# Patient Record
Sex: Female | Born: 1970
Health system: Southern US, Community
[De-identification: ages and names within clinical notes are randomized; demographics above are authoritative.]

## PROBLEM LIST (undated history)

## (undated) DIAGNOSIS — Z808 Family history of malignant neoplasm of other organs or systems: Secondary | ICD-10-CM

## (undated) DIAGNOSIS — N200 Calculus of kidney: Secondary | ICD-10-CM

## (undated) DIAGNOSIS — Z803 Family history of malignant neoplasm of breast: Secondary | ICD-10-CM

## (undated) DIAGNOSIS — I10 Essential (primary) hypertension: Secondary | ICD-10-CM

## (undated) DIAGNOSIS — K219 Gastro-esophageal reflux disease without esophagitis: Secondary | ICD-10-CM

## (undated) HISTORY — DX: Calculus of kidney: N20.0

## (undated) HISTORY — DX: Family history of malignant neoplasm of other organs or systems: Z80.8

## (undated) HISTORY — DX: Family history of malignant neoplasm of breast: Z80.3

## (undated) HISTORY — PX: BREAST SURGERY: SHX581

---

## 2010-11-11 ENCOUNTER — Emergency Department (HOSPITAL_BASED_OUTPATIENT_CLINIC_OR_DEPARTMENT_OTHER)
Admission: EM | Admit: 2010-11-11 | Discharge: 2010-11-11 | Payer: Self-pay | Source: Home / Self Care | Admitting: Emergency Medicine

## 2010-11-11 ENCOUNTER — Emergency Department (HOSPITAL_COMMUNITY)
Admission: EM | Admit: 2010-11-11 | Discharge: 2010-11-11 | Payer: Self-pay | Source: Home / Self Care | Admitting: Emergency Medicine

## 2011-02-18 LAB — URINALYSIS, ROUTINE W REFLEX MICROSCOPIC
Bilirubin Urine: NEGATIVE
Glucose, UA: NEGATIVE mg/dL
Ketones, ur: NEGATIVE mg/dL
Leukocytes, UA: NEGATIVE
Protein, ur: NEGATIVE mg/dL

## 2011-02-18 LAB — CBC
Hemoglobin: 12.3 g/dL (ref 12.0–15.0)
Platelets: 345 10*3/uL (ref 150–400)
RBC: 4.66 MIL/uL (ref 3.87–5.11)
WBC: 9.4 10*3/uL (ref 4.0–10.5)

## 2011-02-18 LAB — URINE MICROSCOPIC-ADD ON

## 2011-02-18 LAB — POCT I-STAT, CHEM 8
BUN: 8 mg/dL (ref 6–23)
Glucose, Bld: 159 mg/dL — ABNORMAL HIGH (ref 70–99)
Potassium: 3.7 mEq/L (ref 3.5–5.1)
Sodium: 143 mEq/L (ref 135–145)

## 2011-02-18 LAB — DIFFERENTIAL
Basophils Absolute: 0 10*3/uL (ref 0.0–0.1)
Basophils Relative: 0 % (ref 0–1)
Eosinophils Absolute: 0 10*3/uL (ref 0.0–0.7)
Eosinophils Relative: 0 % (ref 0–5)
Lymphocytes Relative: 5 % — ABNORMAL LOW (ref 12–46)
Monocytes Absolute: 0.1 10*3/uL (ref 0.1–1.0)

## 2011-12-09 HISTORY — PX: NASAL SEPTUM SURGERY: SHX37

## 2013-06-29 ENCOUNTER — Encounter (HOSPITAL_BASED_OUTPATIENT_CLINIC_OR_DEPARTMENT_OTHER): Payer: Self-pay | Admitting: Emergency Medicine

## 2013-06-29 ENCOUNTER — Emergency Department (HOSPITAL_BASED_OUTPATIENT_CLINIC_OR_DEPARTMENT_OTHER): Payer: Commercial Managed Care - PPO

## 2013-06-29 ENCOUNTER — Emergency Department (HOSPITAL_BASED_OUTPATIENT_CLINIC_OR_DEPARTMENT_OTHER)
Admission: EM | Admit: 2013-06-29 | Discharge: 2013-06-29 | Disposition: A | Discharge status: Home / Self Care | Payer: Commercial Managed Care - PPO | Attending: Emergency Medicine | Admitting: Emergency Medicine

## 2013-06-29 DIAGNOSIS — H538 Other visual disturbances: Secondary | ICD-10-CM | POA: Insufficient documentation

## 2013-06-29 DIAGNOSIS — G43909 Migraine, unspecified, not intractable, without status migrainosus: Secondary | ICD-10-CM | POA: Insufficient documentation

## 2013-06-29 DIAGNOSIS — R110 Nausea: Secondary | ICD-10-CM | POA: Insufficient documentation

## 2013-06-29 DIAGNOSIS — R42 Dizziness and giddiness: Secondary | ICD-10-CM | POA: Insufficient documentation

## 2013-06-29 MED ORDER — METOCLOPRAMIDE HCL 5 MG/ML IJ SOLN
10.0000 mg | Freq: Once | INTRAMUSCULAR | Status: AC
  Administered 2013-06-29: 10 mg via INTRAVENOUS
  Filled 2013-06-29: qty 2

## 2013-06-29 MED ORDER — SODIUM CHLORIDE 0.9 % IV BOLUS (SEPSIS)
1000.0000 mL | Freq: Once | INTRAVENOUS | Status: AC
  Administered 2013-06-29: 1000 mL via INTRAVENOUS

## 2013-06-29 MED ORDER — DIPHENHYDRAMINE HCL 50 MG/ML IJ SOLN
25.0000 mg | Freq: Once | INTRAMUSCULAR | Status: AC
  Administered 2013-06-29: 25 mg via INTRAVENOUS
  Filled 2013-06-29: qty 1

## 2013-06-29 NOTE — ED Notes (Signed)
Pt has had headache for the last week, gotten progressively worse. States it's on the top of her head, feels crushing. Rates pain 5 out of 10. Currently feels nauseous and dizzy. Denies chest pain, denies vomiting. States she's had vision changes with her headache. Took benydrl yesterday morning, soma in the afternoon, and took a hydrocodone last night, these medications did not relieve symptoms. States previously she's had fluid in her ear that's caused headaches, usually gets an allergy shot which relieves symptoms. Yesterday morning received an allergy shot at around noon, did not help symptoms.

## 2013-06-29 NOTE — ED Notes (Signed)
Patient transported to CT 

## 2013-06-29 NOTE — ED Provider Notes (Signed)
History    CSN: [id number] Arrival date & time 06/29/13  1137  First MD Initiated Contact with Patient 06/29/13 1247     Chief Complaint  Patient presents with  . Headache  . Dizziness  . Nausea   (Consider location/radiation/quality/duration/timing/severity/associated sxs/prior Treatment) The history is provided by the patient. No language interpreter was used.  Ashley Nichols is a 42 y/o F with PMHx of tension headaches presenting to the ED, with husband, with headache that has been continuous for the past week. Patient reported that she has been having a squeezing sensation to the top of her head that is constant, described as a pressure sensation. Stated that she is constantly feeling dizzy - whether at rest or with motion - described as if she is spinning. Patient reported that she has been having bilateral blurred vision this past week as well. Stated that this is different from her previous headaches- normally her headaches are frontal and last just a day without blurred vision and dizziness. Patient reported that she has used Excedrin, Iburpofen, Aleve without relief. Reported nausea as well. Denied fever, chills, speech difficulty, vomiting, neck pain, back pain, numbness, facial asymmetry, chest pain, shortness of breath, difficulty breathing. Denied worst headache of her life, denied sudden onset.  PCP Dr. Darlina Sicilian Patient reported that she has an appointment with [facility] on Monday 07/04/2013.    History reviewed. No pertinent past medical history. Past Surgical History  Procedure Laterality Date  . Breast surgery    . Nasal septum surgery  2013   History reviewed. No pertinent family history. History  Substance Use Topics  . Smoking status: Not on file  . Smokeless tobacco: Not on file  . Alcohol Use: Not on file   OB History   Grav Para Term Preterm Abortions TAB SAB Ect Mult Living                 Review of Systems  Constitutional: Negative for fever and  chills.  HENT: Negative for neck pain.   Eyes: Positive for visual disturbance.  Respiratory: Negative for cough, chest tightness and shortness of breath.   Cardiovascular: Negative for chest pain.  Gastrointestinal: Positive for nausea. Negative for vomiting and abdominal pain.  Neurological: Positive for dizziness and headaches. Negative for speech difficulty, weakness and numbness.  All other systems reviewed and are negative.    Allergies  Ivp dye  Home Medications  No current outpatient prescriptions on file. BP 128/79  Pulse 64  Temp(Src) 98.6 F (37 C)  Resp 18  Ht 5\' 6"  (1.676 m)  Wt 191 lb (86.637 kg)  BMI 30.84 kg/m2  SpO2 100%  LMP 06/19/2013 Physical Exam  Nursing note and vitals reviewed. Constitutional: She is oriented to person, place, and time. She appears well-developed and well-nourished. No distress.  HENT:  Head: Normocephalic and atraumatic.  Negative pain upon palpation to the face Negative facial asymmetry  Eyes: Conjunctivae and EOM are normal. Pupils are equal, round, and reactive to light. Right eye exhibits no discharge. Left eye exhibits no discharge.  Neck: Normal range of motion. Neck supple.  Cardiovascular: Normal rate, regular rhythm and normal heart sounds.  Exam reveals no friction rub.   No murmur heard. Pulses:      Radial pulses are 2+ on the right side, and 2+ on the left side.       Dorsalis pedis pulses are 2+ on the right side, and 2+ on the left side.  Pulmonary/Chest: Effort normal  and breath sounds normal. No respiratory distress. She has no wheezes. She has no rales.  Musculoskeletal: Normal range of motion.  Neurological: She is alert and oriented to person, place, and time. No cranial nerve deficit. She exhibits normal muscle tone. Coordination normal.  Cranial nerves III-XII grossly intact Sensation intact Strength 5+/5+ with resistance Negative Romberg Gait balanced and steady - without sway Negative speech difficulty  noted  Skin: Skin is warm and dry. No rash noted. She is not diaphoretic. No erythema.  Psychiatric: She has a normal mood and affect. Her behavior is normal. Thought content normal.    ED Course  Procedures (including critical care time)  3:32PM Patient reported that headache has improved tremendously. Denied nausea. Denied blurred vision, dizziness. Patient reported that she is feeling much better and is ready to go home.   Labs Reviewed - No data to display Ct Head Wo Contrast  06/29/2013   *RADIOLOGY REPORT*  Clinical Data: Headache, blurred vision, dizziness  CT HEAD WITHOUT CONTRAST  Technique:  Contiguous axial images were obtained from the base of the skull through the vertex without contrast.  Comparison: Brain MRI 11/11/2010 and head CT 11/11/2010 at [facility]  Findings: No acute hemorrhage, acute infarction, or mass lesion is identified.  No midline shift.  No ventriculomegaly.  No skull fracture.  Orbits and paranasal sinuses are intact.  IMPRESSION: No acute intracranial finding.   Original Report Authenticated By: Christiana Pellant, M.D.   1. Migraine     MDM  Patient presenting to the ED with headache. Stated that this is not the worst headache of her life. Patient has history of headaches, reported that this is different with dizziness, blurred vision, and longer lasting. Negative neurological deficits noted. Negative facial drooping. Pulses palpable. sensation intact. Gait proper and steady. Alert and oriented. Negative signs of weakness. CT scan head negative intracranial abnormalities, negative acute findings.  Patient doing well and feels better after medication. Doubt SAH. Doubt stroke. Suspicion to be migraine. Discussed case with Dr. Ardeen Jourdain who agreed. Discharged patient. Discussed with patient to rest and stay hydrated. Discussed with patient to take Excedrin over the counter. Referred patient to PCP, neurology - recommended patient to keep appointment  with headache clinic on Monday 07/04/2013. Discussed with patient that she will most likely need to be placed on medication to aid in reducing reoccurence of headaches and for headache management. Discussed with patient to avoid strenuous activities. Discussed with patient to continue to monitor symptoms and if symptoms are to worsen or change to report back to the ED - strict return instructions given.  Patient agreed to plan of care, understood, all questions answered.   Raymon Mutton, PA-C 06/29/13 2348

## 2013-07-01 NOTE — ED Provider Notes (Signed)
Medical screening examination/treatment/procedure(s) were performed by non-physician practitioner and as supervising physician I was immediately available for consultation/collaboration.   Rolan Bucco, MD 07/01/13 [phone]

## 2015-12-03 ENCOUNTER — Emergency Department (HOSPITAL_BASED_OUTPATIENT_CLINIC_OR_DEPARTMENT_OTHER): Payer: Commercial Managed Care - PPO

## 2015-12-03 ENCOUNTER — Encounter (HOSPITAL_COMMUNITY)
Admission: EM | Disposition: A | Discharge status: Home / Self Care | Payer: Self-pay | Source: Home / Self Care | Attending: Emergency Medicine

## 2015-12-03 ENCOUNTER — Emergency Department (HOSPITAL_COMMUNITY): Payer: Commercial Managed Care - PPO

## 2015-12-03 ENCOUNTER — Encounter (HOSPITAL_BASED_OUTPATIENT_CLINIC_OR_DEPARTMENT_OTHER): Payer: Self-pay

## 2015-12-03 ENCOUNTER — Emergency Department (HOSPITAL_COMMUNITY): Payer: Commercial Managed Care - PPO | Admitting: Certified Registered Nurse Anesthetist

## 2015-12-03 ENCOUNTER — Ambulatory Visit (HOSPITAL_BASED_OUTPATIENT_CLINIC_OR_DEPARTMENT_OTHER)
Admission: EM | Admit: 2015-12-03 | Discharge: 2015-12-04 | Disposition: A | Discharge status: Home / Self Care | Payer: Commercial Managed Care - PPO | Attending: Surgery | Admitting: Surgery

## 2015-12-03 DIAGNOSIS — K8010 Calculus of gallbladder with chronic cholecystitis without obstruction: Secondary | ICD-10-CM | POA: Insufficient documentation

## 2015-12-03 DIAGNOSIS — I10 Essential (primary) hypertension: Secondary | ICD-10-CM | POA: Insufficient documentation

## 2015-12-03 DIAGNOSIS — E669 Obesity, unspecified: Secondary | ICD-10-CM | POA: Insufficient documentation

## 2015-12-03 DIAGNOSIS — R109 Unspecified abdominal pain: Secondary | ICD-10-CM | POA: Diagnosis present

## 2015-12-03 DIAGNOSIS — Z6834 Body mass index (BMI) 34.0-34.9, adult: Secondary | ICD-10-CM | POA: Diagnosis not present

## 2015-12-03 DIAGNOSIS — K8020 Calculus of gallbladder without cholecystitis without obstruction: Secondary | ICD-10-CM

## 2015-12-03 DIAGNOSIS — K810 Acute cholecystitis: Secondary | ICD-10-CM | POA: Diagnosis present

## 2015-12-03 DIAGNOSIS — K219 Gastro-esophageal reflux disease without esophagitis: Secondary | ICD-10-CM | POA: Insufficient documentation

## 2015-12-03 HISTORY — DX: Gastro-esophageal reflux disease without esophagitis: K21.9

## 2015-12-03 HISTORY — PX: CHOLECYSTECTOMY: SHX55

## 2015-12-03 HISTORY — DX: Essential (primary) hypertension: I10

## 2015-12-03 LAB — URINE MICROSCOPIC-ADD ON

## 2015-12-03 LAB — PREGNANCY, URINE: Preg Test, Ur: NEGATIVE

## 2015-12-03 LAB — CBC WITH DIFFERENTIAL/PLATELET
BASOS ABS: 0 10*3/uL (ref 0.0–0.1)
Basophils Relative: 0 %
EOS PCT: 1 %
Eosinophils Absolute: 0.1 10*3/uL (ref 0.0–0.7)
HEMATOCRIT: 33.9 % — AB (ref 36.0–46.0)
Hemoglobin: 10.6 g/dL — ABNORMAL LOW (ref 12.0–15.0)
LYMPHS ABS: 2.6 10*3/uL (ref 0.7–4.0)
LYMPHS PCT: 26 %
MCH: 24.1 pg — AB (ref 26.0–34.0)
MCHC: 31.3 g/dL (ref 30.0–36.0)
MCV: 77 fL — AB (ref 78.0–100.0)
MONO ABS: 0.9 10*3/uL (ref 0.1–1.0)
MONOS PCT: 9 %
Neutro Abs: 6.3 10*3/uL (ref 1.7–7.7)
Neutrophils Relative %: 64 %
PLATELETS: 461 10*3/uL — AB (ref 150–400)
RBC: 4.4 MIL/uL (ref 3.87–5.11)
RDW: 15.6 % — AB (ref 11.5–15.5)
WBC: 10 10*3/uL (ref 4.0–10.5)

## 2015-12-03 LAB — LIPASE, BLOOD: Lipase: 31 U/L (ref 11–51)

## 2015-12-03 LAB — COMPREHENSIVE METABOLIC PANEL
ALT: 19 U/L (ref 14–54)
ANION GAP: 7 (ref 5–15)
AST: 19 U/L (ref 15–41)
Albumin: 3.3 g/dL — ABNORMAL LOW (ref 3.5–5.0)
Alkaline Phosphatase: 118 U/L (ref 38–126)
BILIRUBIN TOTAL: 0.4 mg/dL (ref 0.3–1.2)
BUN: 14 mg/dL (ref 6–20)
CHLORIDE: 104 mmol/L (ref 101–111)
CO2: 27 mmol/L (ref 22–32)
Calcium: 9.5 mg/dL (ref 8.9–10.3)
Creatinine, Ser: 0.77 mg/dL (ref 0.44–1.00)
Glucose, Bld: 93 mg/dL (ref 65–99)
POTASSIUM: 3.3 mmol/L — AB (ref 3.5–5.1)
Sodium: 138 mmol/L (ref 135–145)
Total Protein: 7.1 g/dL (ref 6.5–8.1)

## 2015-12-03 LAB — URINALYSIS, ROUTINE W REFLEX MICROSCOPIC
BILIRUBIN URINE: NEGATIVE
Glucose, UA: NEGATIVE mg/dL
HGB URINE DIPSTICK: NEGATIVE
Ketones, ur: NEGATIVE mg/dL
NITRITE: NEGATIVE
PH: 8 (ref 5.0–8.0)
Protein, ur: NEGATIVE mg/dL
SPECIFIC GRAVITY, URINE: 1.01 (ref 1.005–1.030)

## 2015-12-03 SURGERY — LAPAROSCOPIC CHOLECYSTECTOMY WITH INTRAOPERATIVE CHOLANGIOGRAM
Anesthesia: General | Site: Abdomen

## 2015-12-03 MED ORDER — PROPOFOL 10 MG/ML IV BOLUS
INTRAVENOUS | Status: DC | PRN
  Administered 2015-12-03: 200 mg via INTRAVENOUS

## 2015-12-03 MED ORDER — MORPHINE SULFATE (PF) 2 MG/ML IV SOLN
2.0000 mg | INTRAVENOUS | Status: DC | PRN
  Administered 2015-12-03: 2 mg via INTRAVENOUS
  Administered 2015-12-03: 4 mg via INTRAVENOUS
  Filled 2015-12-03 (×2): qty 1
  Filled 2015-12-03: qty 2

## 2015-12-03 MED ORDER — METOCLOPRAMIDE HCL 5 MG/ML IJ SOLN
INTRAMUSCULAR | Status: DC | PRN
  Administered 2015-12-03: 10 mg via INTRAVENOUS

## 2015-12-03 MED ORDER — KCL IN DEXTROSE-NACL 20-5-0.9 MEQ/L-%-% IV SOLN
INTRAVENOUS | Status: DC
  Administered 2015-12-03: 18:00:00 via INTRAVENOUS
  Administered 2015-12-04: 100 mL/h via INTRAVENOUS
  Filled 2015-12-03 (×3): qty 1000

## 2015-12-03 MED ORDER — MIDAZOLAM HCL 5 MG/5ML IJ SOLN
INTRAMUSCULAR | Status: DC | PRN
  Administered 2015-12-03: 2 mg via INTRAVENOUS

## 2015-12-03 MED ORDER — DEXAMETHASONE SODIUM PHOSPHATE 10 MG/ML IJ SOLN
INTRAMUSCULAR | Status: DC | PRN
  Administered 2015-12-03: 10 mg via INTRAVENOUS

## 2015-12-03 MED ORDER — BUPIVACAINE HCL (PF) 0.5 % IJ SOLN
INTRAMUSCULAR | Status: AC
  Filled 2015-12-03: qty 30

## 2015-12-03 MED ORDER — ONDANSETRON HCL 4 MG/2ML IJ SOLN: 4.0000 mg | Freq: Four times a day (QID) | INTRAMUSCULAR | Status: DC | PRN

## 2015-12-03 MED ORDER — SUGAMMADEX SODIUM 200 MG/2ML IV SOLN
INTRAVENOUS | Status: AC
  Filled 2015-12-03: qty 4

## 2015-12-03 MED ORDER — HYDROMORPHONE HCL 1 MG/ML IJ SOLN
1.0000 mg | Freq: Once | INTRAMUSCULAR | Status: AC
  Administered 2015-12-03: 1 mg via INTRAVENOUS
  Filled 2015-12-03: qty 1

## 2015-12-03 MED ORDER — FENTANYL CITRATE (PF) 250 MCG/5ML IJ SOLN
INTRAMUSCULAR | Status: DC | PRN
  Administered 2015-12-03 (×2): 50 ug via INTRAVENOUS
  Administered 2015-12-03: 150 ug via INTRAVENOUS

## 2015-12-03 MED ORDER — METOCLOPRAMIDE HCL 5 MG/ML IJ SOLN
INTRAMUSCULAR | Status: AC
  Filled 2015-12-03: qty 2

## 2015-12-03 MED ORDER — BUPIVACAINE HCL 0.5 % IJ SOLN
INTRAMUSCULAR | Status: DC | PRN
  Administered 2015-12-03: 20 mL

## 2015-12-03 MED ORDER — LACTATED RINGERS IV SOLN: INTRAVENOUS | Status: DC

## 2015-12-03 MED ORDER — FENTANYL CITRATE (PF) 100 MCG/2ML IJ SOLN
25.0000 ug | INTRAMUSCULAR | Status: DC | PRN
  Administered 2015-12-03 (×2): 50 ug via INTRAVENOUS

## 2015-12-03 MED ORDER — OXYCODONE HCL 5 MG PO TABS
5.0000 mg | ORAL_TABLET | ORAL | Status: DC | PRN
  Administered 2015-12-04 (×2): 5 mg via ORAL
  Filled 2015-12-03 (×2): qty 1

## 2015-12-03 MED ORDER — ONDANSETRON HCL 4 MG/2ML IJ SOLN
INTRAMUSCULAR | Status: DC | PRN
  Administered 2015-12-03: 4 mg via INTRAVENOUS

## 2015-12-03 MED ORDER — SUCCINYLCHOLINE CHLORIDE 20 MG/ML IJ SOLN
INTRAMUSCULAR | Status: DC | PRN
  Administered 2015-12-03: 100 mg via INTRAVENOUS

## 2015-12-03 MED ORDER — PHENYLEPHRINE 40 MCG/ML (10ML) SYRINGE FOR IV PUSH (FOR BLOOD PRESSURE SUPPORT)
PREFILLED_SYRINGE | INTRAVENOUS | Status: AC
  Filled 2015-12-03: qty 10

## 2015-12-03 MED ORDER — DEXAMETHASONE SODIUM PHOSPHATE 10 MG/ML IJ SOLN
INTRAMUSCULAR | Status: AC
  Filled 2015-12-03: qty 1

## 2015-12-03 MED ORDER — PROMETHAZINE HCL 25 MG/ML IJ SOLN: 6.2500 mg | INTRAMUSCULAR | Status: DC | PRN

## 2015-12-03 MED ORDER — NORTRIPTYLINE HCL 25 MG PO CAPS
100.0000 mg | ORAL_CAPSULE | Freq: Every day | ORAL | Status: DC
  Administered 2015-12-03: 100 mg via ORAL
  Filled 2015-12-03 (×4): qty 4

## 2015-12-03 MED ORDER — SCOPOLAMINE 1 MG/3DAYS TD PT72
MEDICATED_PATCH | TRANSDERMAL | Status: AC
  Filled 2015-12-03: qty 1

## 2015-12-03 MED ORDER — LACTATED RINGERS IR SOLN
Status: DC | PRN
  Administered 2015-12-03: 2000 mL

## 2015-12-03 MED ORDER — FENTANYL CITRATE (PF) 100 MCG/2ML IJ SOLN
INTRAMUSCULAR | Status: AC
  Administered 2015-12-03: 50 ug via INTRAVENOUS
  Filled 2015-12-03: qty 2

## 2015-12-03 MED ORDER — ONDANSETRON 4 MG PO TBDP: 4.0000 mg | ORAL_TABLET | Freq: Four times a day (QID) | ORAL | Status: DC | PRN

## 2015-12-03 MED ORDER — MIDAZOLAM HCL 2 MG/2ML IJ SOLN
INTRAMUSCULAR | Status: AC
  Filled 2015-12-03: qty 2

## 2015-12-03 MED ORDER — LIDOCAINE HCL (CARDIAC) 20 MG/ML IV SOLN
INTRAVENOUS | Status: DC | PRN
  Administered 2015-12-03: 100 mg via INTRATRACHEAL

## 2015-12-03 MED ORDER — DEXTROSE 5 % IV SOLN
2.0000 g | Freq: Once | INTRAVENOUS | Status: AC
  Administered 2015-12-03: 2 g via INTRAVENOUS
  Filled 2015-12-03: qty 2

## 2015-12-03 MED ORDER — LIDOCAINE HCL (CARDIAC) 20 MG/ML IV SOLN
INTRAVENOUS | Status: AC
  Filled 2015-12-03: qty 5

## 2015-12-03 MED ORDER — PROPOFOL 10 MG/ML IV BOLUS
INTRAVENOUS | Status: AC
  Filled 2015-12-03: qty 20

## 2015-12-03 MED ORDER — ROCURONIUM BROMIDE 100 MG/10ML IV SOLN
INTRAVENOUS | Status: AC
  Filled 2015-12-03: qty 1

## 2015-12-03 MED ORDER — IOHEXOL 300 MG/ML  SOLN
INTRAMUSCULAR | Status: DC | PRN
  Administered 2015-12-03: 4.5 mL

## 2015-12-03 MED ORDER — MEPERIDINE HCL 50 MG/ML IJ SOLN: 6.2500 mg | INTRAMUSCULAR | Status: DC | PRN

## 2015-12-03 MED ORDER — ROCURONIUM BROMIDE 100 MG/10ML IV SOLN
INTRAVENOUS | Status: DC | PRN
  Administered 2015-12-03: 38 mg via INTRAVENOUS
  Administered 2015-12-03: 2 mg via INTRAVENOUS

## 2015-12-03 MED ORDER — 0.9 % SODIUM CHLORIDE (POUR BTL) OPTIME
TOPICAL | Status: DC | PRN
  Administered 2015-12-03: 1000 mL

## 2015-12-03 MED ORDER — ONDANSETRON HCL 4 MG/2ML IJ SOLN
4.0000 mg | Freq: Once | INTRAMUSCULAR | Status: AC
  Administered 2015-12-03: 4 mg via INTRAVENOUS

## 2015-12-03 MED ORDER — SCOPOLAMINE 1 MG/3DAYS TD PT72
MEDICATED_PATCH | TRANSDERMAL | Status: DC | PRN
  Administered 2015-12-03: 1 via TRANSDERMAL

## 2015-12-03 MED ORDER — SUGAMMADEX SODIUM 200 MG/2ML IV SOLN
INTRAVENOUS | Status: DC | PRN
  Administered 2015-12-03: 200 mg via INTRAVENOUS

## 2015-12-03 MED ORDER — FENTANYL CITRATE (PF) 250 MCG/5ML IJ SOLN
INTRAMUSCULAR | Status: AC
  Filled 2015-12-03: qty 5

## 2015-12-03 MED ORDER — PHENYLEPHRINE HCL 10 MG/ML IJ SOLN
INTRAMUSCULAR | Status: DC | PRN
  Administered 2015-12-03: 80 ug via INTRAVENOUS

## 2015-12-03 MED ORDER — HEPARIN SODIUM (PORCINE) 5000 UNIT/ML IJ SOLN
5000.0000 [IU] | Freq: Three times a day (TID) | INTRAMUSCULAR | Status: DC
  Filled 2015-12-03 (×3): qty 1

## 2015-12-03 MED ORDER — ONDANSETRON HCL 4 MG/2ML IJ SOLN
INTRAMUSCULAR | Status: AC
  Filled 2015-12-03: qty 2

## 2015-12-03 MED ORDER — LACTATED RINGERS IV SOLN
INTRAVENOUS | Status: DC | PRN
  Administered 2015-12-03: 14:00:00 via INTRAVENOUS

## 2015-12-03 SURGICAL SUPPLY — 38 items
APPLIER CLIP 5 13 M/L LIGAMAX5 (MISCELLANEOUS) ×2
BENZOIN TINCTURE PRP APPL 2/3 (GAUZE/BANDAGES/DRESSINGS) ×2 IMPLANT
CATH REDDICK CHOLANGI 4FR 50CM (CATHETERS) ×2 IMPLANT
CHLORAPREP W/TINT 26ML (MISCELLANEOUS) ×2 IMPLANT
CLIP APPLIE 5 13 M/L LIGAMAX5 (MISCELLANEOUS) ×1 IMPLANT
COVER MAYO STAND STRL (DRAPES) ×2 IMPLANT
COVER SURGICAL LIGHT HANDLE (MISCELLANEOUS) ×2 IMPLANT
DECANTER SPIKE VIAL GLASS SM (MISCELLANEOUS) ×2 IMPLANT
DISSECTOR BLUNT TIP ENDO 5MM (MISCELLANEOUS) IMPLANT
DRAPE C-ARM 42X120 X-RAY (DRAPES) ×2 IMPLANT
DRAPE LAPAROSCOPIC ABDOMINAL (DRAPES) ×2 IMPLANT
DRSG TEGADERM 2-3/8X2-3/4 SM (GAUZE/BANDAGES/DRESSINGS) ×6 IMPLANT
ELECT REM PT RETURN 9FT ADLT (ELECTROSURGICAL) ×2
ELECTRODE REM PT RTRN 9FT ADLT (ELECTROSURGICAL) ×1 IMPLANT
ENDOLOOP SUT PDS II  0 18 (SUTURE)
ENDOLOOP SUT PDS II 0 18 (SUTURE) IMPLANT
GAUZE SPONGE 2X2 8PLY STRL LF (GAUZE/BANDAGES/DRESSINGS) ×1 IMPLANT
GLOVE ECLIPSE 8.0 STRL XLNG CF (GLOVE) ×4 IMPLANT
GLOVE INDICATOR 8.0 STRL GRN (GLOVE) ×4 IMPLANT
GOWN STRL REUS W/TWL XL LVL3 (GOWN DISPOSABLE) ×8 IMPLANT
HEMOSTAT SNOW SURGICEL 2X4 (HEMOSTASIS) IMPLANT
IV CATH 14GX2 1/4 (CATHETERS) ×2 IMPLANT
IV LACTATED RINGERS 1000ML (IV SOLUTION) ×4 IMPLANT
KIT BASIN OR (CUSTOM PROCEDURE TRAY) ×2 IMPLANT
NS IRRIG 1000ML POUR BTL (IV SOLUTION) ×2 IMPLANT
POUCH SPECIMEN RETRIEVAL 10MM (ENDOMECHANICALS) ×2 IMPLANT
SCISSORS LAP 5X35 DISP (ENDOMECHANICALS) ×2 IMPLANT
SET IRRIG TUBING LAPAROSCOPIC (IRRIGATION / IRRIGATOR) ×2 IMPLANT
SLEEVE XCEL OPT CAN 5 100 (ENDOMECHANICALS) ×4 IMPLANT
SPONGE GAUZE 2X2 STER 10/PKG (GAUZE/BANDAGES/DRESSINGS) ×1
STRIP CLOSURE SKIN 1/2X4 (GAUZE/BANDAGES/DRESSINGS) ×2 IMPLANT
SUT MNCRL AB 4-0 PS2 18 (SUTURE) ×2 IMPLANT
TOWEL OR 17X26 10 PK STRL BLUE (TOWEL DISPOSABLE) ×2 IMPLANT
TOWEL OR NON WOVEN STRL DISP B (DISPOSABLE) ×2 IMPLANT
TRAY LAPAROSCOPIC (CUSTOM PROCEDURE TRAY) ×2 IMPLANT
TROCAR BLADELESS OPT 5 100 (ENDOMECHANICALS) ×2 IMPLANT
TROCAR XCEL BLUNT TIP 100MML (ENDOMECHANICALS) ×2 IMPLANT
TROCAR XCEL NON-BLD 11X100MML (ENDOMECHANICALS) ×2 IMPLANT

## 2015-12-03 NOTE — ED Notes (Signed)
Pt c/o mid-epigastric pain that radiates to her back with nausea that has gotten increasingly worse since yesterday.

## 2015-12-03 NOTE — Anesthesia Postprocedure Evaluation (Signed)
Anesthesia Post Note  Patient: Ashley Nichols  Procedure(s) Performed: Procedure(s) (LRB): LAPAROSCOPIC CHOLECYSTECTOMY WITH INTRAOPERATIVE CHOLANGIOGRAM (N/A)  Patient location during evaluation: PACU Anesthesia Type: General Level of consciousness: awake and alert Pain management: pain level controlled Vital Signs Assessment: post-procedure vital signs reviewed and stable Respiratory status: spontaneous breathing, nonlabored ventilation, respiratory function stable and patient connected to nasal cannula oxygen Cardiovascular status: blood pressure returned to baseline and stable Postop Assessment: no signs of nausea or vomiting Anesthetic complications: no    Last Vitals:  Filed Vitals:   12/03/15 1544 12/03/15 1545  BP: 146/74 143/77  Pulse: 96 96  Temp: 36.9 C   Resp: 21 19    Last Pain:  Filed Vitals:   12/03/15 1548  PainSc: 2                  [name], [name]

## 2015-12-03 NOTE — Anesthesia Preprocedure Evaluation (Signed)
Anesthesia Evaluation  Patient identified by MRN, date of birth, ID band Patient awake    Reviewed: Allergy & Precautions, NPO status , Patient's Chart, lab work & pertinent test results  Airway Mallampati: II  TM Distance: >3 FB Neck ROM: Full    Dental no notable dental hx.    Pulmonary neg pulmonary ROS,    Pulmonary exam normal breath sounds clear to auscultation       Cardiovascular hypertension, Pt. on medications Normal cardiovascular exam Rhythm:Regular Rate:Normal     Neuro/Psych negative neurological ROS  negative psych ROS   GI/Hepatic negative GI ROS, Neg liver ROS,   Endo/Other  negative endocrine ROS  Renal/GU negative Renal ROS  negative genitourinary   Musculoskeletal negative musculoskeletal ROS (+)   Abdominal   Peds negative pediatric ROS (+)  Hematology negative hematology ROS (+)   Anesthesia Other Findings   Reproductive/Obstetrics negative OB ROS                            Anesthesia Physical Anesthesia Plan  ASA: II  Anesthesia Plan: General   Post-op Pain Management:    Induction: Intravenous  Airway Management Planned: Oral ETT  Additional Equipment:   Intra-op Plan:   Post-operative Plan: Extubation in OR  Informed Consent: I have reviewed the patients History and Physical, chart, labs and discussed the procedure including the risks, benefits and alternatives for the proposed anesthesia with the patient or authorized representative who has indicated his/her understanding and acceptance.   Dental advisory given  Plan Discussed with: CRNA  Anesthesia Plan Comments:         Anesthesia Quick Evaluation  

## 2015-12-03 NOTE — Anesthesia Procedure Notes (Signed)
Date/Time: 12/03/2015 2:18 PM Performed by: [place], Zamira Hickam C Pre-anesthesia Checklist: Patient identified, Timeout performed, Suction available, Patient being monitored and Emergency Drugs available Patient Re-evaluated:Patient Re-evaluated prior to inductionOxygen Delivery Method: Circle system utilized Preoxygenation: Pre-oxygenation with 100% oxygen Intubation Type: IV induction Ventilation: Mask ventilation without difficulty Laryngoscope Size: Mac and 3 Grade View: Grade I Tube size: 7.5 mm Number of attempts: 1 Airway Equipment and Method: Stylet

## 2015-12-03 NOTE — Discharge Instructions (Addendum)
LAPAROSCOPIC SURGERY: POST OP INSTRUCTIONS ° °1. DIET: Follow a light bland diet the first 24 hours after arrival home, such as soup, liquids, crackers, etc.  Be sure to include lots of fluids daily.  Avoid fast food or heavy meals as your are more likely to get nauseated.  Eat a low fat the next few days after surgery.   °2. Take your usually prescribed home medications unless otherwise directed. °3. PAIN CONTROL: °a. Pain is best controlled by a usual combination of three different methods TOGETHER: °i. Ice/Heat °ii. Over the counter pain medication °iii. Prescription pain medication °b. Most patients will experience some swelling and bruising around the incisions.  Ice packs or heating pads (30-60 minutes up to 6 times a day) will help. Use ice for the first few days to help decrease swelling and bruising, then switch to heat to help relax tight/sore spots and speed recovery.  Some people prefer to use ice alone, heat alone, alternating between ice & heat.  Experiment to what works for you.  Swelling and bruising can take several weeks to resolve.   °c. It is helpful to take an over-the-counter pain medication regularly for the first few weeks.  Choose one of the following that works best for you: °i. Naproxen (Aleve, etc)  Two 220mg tabs twice a day °ii. Ibuprofen (Advil, etc) Three 200mg tabs four times a day (every meal & bedtime) °iii. Acetaminophen (Tylenol, etc) 500-650mg four times a day (every meal & bedtime) °d. A  prescription for pain medication (such as oxycodone, hydrocodone, etc) should be given to you upon discharge.  Take your pain medication as prescribed.  °i. If you are having problems/concerns with the prescription medicine (does not control pain, nausea, vomiting, rash, itching, etc), please call us [phone] to see if we need to switch you to a different pain medicine that will work better for you and/or control your side effect better. °ii. If you need a refill on your pain medication,  please contact your pharmacy.  They will contact our office to request authorization. Prescriptions will not be filled after 5 pm or on week-ends. °4. Avoid getting constipated.  Between the surgery and the pain medications, it is common to experience some constipation.  Increasing fluid intake and taking a fiber supplement (such as Metamucil, Citrucel, FiberCon, MiraLax, etc) 1-2 times a day regularly will usually help prevent this problem from occurring.  A mild laxative (prune juice, Milk of Magnesia, MiraLax, etc) should be taken according to package directions if there are no bowel movements after 48 hours.   °5. Watch out for diarrhea.  If you have many loose bowel movements, simplify your diet to bland foods & liquids for a few days.  Stop any stool softeners and decrease your fiber supplement.  Switching to mild anti-diarrheal medications (Kayopectate, Pepto Bismol) can help.  If this worsens or does not improve, please call us. °6. Wash / shower every day.  You may shower over the dressings as they are waterproof.  Continue to shower over incision(s) after the dressing is off. °7. Remove your waterproof bandages 3 days after surgery.  You may leave the incision open to air.  You may replace a dressing/Band-Aid to cover the incision for comfort if you wish.  °8. ACTIVITIES as tolerated:   °a. You may resume regular (light) daily activities beginning the next day--such as daily self-care, walking, climbing stairs--gradually increasing light activities as tolerated.  No heavy lifting (over 10 pounds), straining, or   intense activities for 2 weeks. °b. DO NOT PUSH THROUGH PAIN.  Let pain be your guide: If it hurts to do something, don't do it.  Pain is your body warning you to avoid that activity for another week until the pain goes down. °c. You may drive when you are no longer taking prescription pain medication, you can comfortably wear a seatbelt, and you can safely maneuver your car and apply  brakes. °d. You may have sexual intercourse when it is comfortable.  °9. FOLLOW UP in our office °a. Please call [facility] at [phone] to set up an appointment to see your surgeon in the office for a follow-up appointment approximately 2-3 weeks after your surgery. °b. Make sure that you call for this appointment the day you arrive home to insure a convenient appointment time. °10. IF YOU HAVE DISABILITY OR FAMILY LEAVE FORMS, BRING THEM TO THE OFFICE FOR PROCESSING.  DO NOT GIVE THEM TO YOUR DOCTOR. ° °11.  Return to work/school:  Desk work/light activities in 5-7 days, full duty/activities in 2 weeks if pain-free. ° ° °WHEN TO CALL US [phone]: °1. Poor pain control °2. Reactions / problems with new medications (rash/itching, nausea, etc)  °3. Fever over 101.5 F (38.5 C) °4. Inability to urinate °5. Nausea and/or vomiting °6. Worsening swelling or bruising °7. Continued bleeding from incision. °8. Increased pain, redness, or drainage from the incision ° ° The clinic staff is available to answer your questions during regular business hours (8:30am-5pm).  Please don’t hesitate to call and ask to speak to one of our nurses for clinical concerns.  ° If you have a medical emergency, go to the nearest emergency room or call 911. ° A surgeon from [facility] is always on call at the hospitals ° ° °[facility] °[address], [address], [place], [place]  [address] ? °MAIN: [phone] ? TOLL FREE: [phone] ?  °FAX [phone] °[email or web address] ° °

## 2015-12-03 NOTE — ED Notes (Signed)
PATIENT BELONGINGS WITH THE CHARGE NURSE

## 2015-12-03 NOTE — Transfer of Care (Signed)
Immediate Anesthesia Transfer of Care Note  Patient: Ashley Nichols  Procedure(s) Performed: Procedure(s): LAPAROSCOPIC CHOLECYSTECTOMY WITH INTRAOPERATIVE CHOLANGIOGRAM (N/A)  Patient Location: PACU  Anesthesia Type:General  Level of Consciousness:  sedated, patient cooperative and responds to stimulation  Airway & Oxygen Therapy:Patient Spontanous Breathing and Patient connected to face mask oxgen  Post-op Assessment:  Report given to PACU RN and Post -op Vital signs reviewed and stable  Post vital signs:  Reviewed and stable  Last Vitals:  Filed Vitals:   12/03/15 1226 12/03/15 1544  BP: 134/78   Pulse: 70   Temp: 36.9 C 36.9 C  Resp: 18     Complications: No apparent anesthesia complicationsImmediate Anesthesia Transfer of Care Note  Patient: Ashley Nichols  Procedure(s) Performed: Procedure(s): LAPAROSCOPIC CHOLECYSTECTOMY WITH INTRAOPERATIVE CHOLANGIOGRAM (N/A)  Patient Location: PACU  Anesthesia Type:General  Level of Consciousness: awake, alert , oriented and patient cooperative  Airway & Oxygen Therapy: Patient Spontanous Breathing and Patient connected to face mask oxygen  Post-op Assessment: Report given to RN, Post -op Vital signs reviewed and stable and Patient moving all extremities X 4  Post vital signs: Reviewed and stable  Last Vitals:  Filed Vitals:   12/03/15 1226 12/03/15 1544  BP: 134/78   Pulse: 70   Temp: 36.9 C 36.9 C  Resp: 18     Complications: No apparent anesthesia complications

## 2015-12-03 NOTE — ED Notes (Signed)
Patient transported to Ultrasound 

## 2015-12-03 NOTE — ED Provider Notes (Signed)
Handoff received from Dr. Rubin Payor at 0800 with plan for f/u US r/o chole.   Results:  BP 132/79 mmHg  Pulse 87  Temp(Src) 97.5 F (36.4 C) (Oral)  Resp 18  Ht  (1.676 m)  Wt 215 lb (97.523 kg)  BMI 34.72 kg/m2  SpO2 100%  LMP 11/12/2015  Results for orders placed or performed during the hospital encounter of 12/03/15  CBC with Differential  Result Value Ref Range   WBC 10.0 4.0 - 10.5 K/uL   RBC 4.40 3.87 - 5.11 MIL/uL   Hemoglobin 10.6 (L) 12.0 - 15.0 g/dL   HCT 40.9 (L) 81.1 - 91.4 %   MCV 77.0 (L) 78.0 - 100.0 fL   MCH 24.1 (L) 26.0 - 34.0 pg   MCHC 31.3 30.0 - 36.0 g/dL   RDW 78.2 (H) 95.6 - 21.3 %   Platelets 461 (H) 150 - 400 K/uL   Neutrophils Relative % 64 %   Neutro Abs 6.3 1.7 - 7.7 K/uL   Lymphocytes Relative 26 %   Lymphs Abs 2.6 0.7 - 4.0 K/uL   Monocytes Relative 9 %   Monocytes Absolute 0.9 0.1 - 1.0 K/uL   Eosinophils Relative 1 %   Eosinophils Absolute 0.1 0.0 - 0.7 K/uL   Basophils Relative 0 %   Basophils Absolute 0.0 0.0 - 0.1 K/uL  Comprehensive metabolic panel  Result Value Ref Range   Sodium 138 135 - 145 mmol/L   Potassium 3.3 (L) 3.5 - 5.1 mmol/L   Chloride 104 101 - 111 mmol/L   CO2 27 22 - 32 mmol/L   Glucose, Bld 93 65 - 99 mg/dL   BUN 14 6 - 20 mg/dL   Creatinine, Ser 0.86 0.44 - 1.00 mg/dL   Calcium 9.5 8.9 - 57.8 mg/dL   Total Protein 7.1 6.5 - 8.1 g/dL   Albumin 3.3 (L) 3.5 - 5.0 g/dL   AST 19 15 - 41 U/L   ALT 19 14 - 54 U/L   Alkaline Phosphatase 118 38 - 126 U/L   Total Bilirubin 0.4 0.3 - 1.2 mg/dL   GFR calc non Af Amer >60 >60 mL/min   GFR calc Af Amer >60 >60 mL/min   Anion gap 7 5 - 15  Lipase, blood  Result Value Ref Range   Lipase 31 11 - 51 U/L  Urinalysis, Routine w reflex microscopic  Result Value Ref Range   Color, Urine YELLOW YELLOW   APPearance CLOUDY (A) CLEAR   Specific Gravity, Urine 1.010 1.005 - 1.030   pH 8.0 5.0 - 8.0   Glucose, UA NEGATIVE NEGATIVE mg/dL   Hgb urine dipstick NEGATIVE  NEGATIVE   Bilirubin Urine NEGATIVE NEGATIVE   Ketones, ur NEGATIVE NEGATIVE mg/dL   Protein, ur NEGATIVE NEGATIVE mg/dL   Nitrite NEGATIVE NEGATIVE   Leukocytes, UA MODERATE (A) NEGATIVE  Pregnancy, urine  Result Value Ref Range   Preg Test, Ur NEGATIVE NEGATIVE  Urine microscopic-add on  Result Value Ref Range   Squamous Epithelial / LPF 6-30 (A) NONE SEEN   WBC, UA 6-30 0 - 5 WBC/hpf   RBC / HPF 0-5 0 - 5 RBC/hpf   Bacteria, UA MANY (A) NONE SEEN    US Abdomen Complete  12/03/2015  CLINICAL DATA:  Epigastric pain with nausea for 1 day. EXAM: ABDOMEN ULTRASOUND COMPLETE COMPARISON:  None. FINDINGS: Gallbladder: There are multiple gallstones as well as sludge in the gallbladder lumen. Gallbladder wall is thickened to 5 mm. There is a  sonographic Murphy's sign according to the sonographer. Common bile duct: Diameter: 3 mm Liver: Mildly echogenic without apparent focal abnormality. IVC: No abnormality visualized. Pancreas: Visualized portion unremarkable. Portions of the pancreas are obscured by bowel gas. Spleen: Size and appearance within normal limits. Right Kidney: Length: 10.8 cm. Echogenicity within normal limits. No mass or hydronephrosis visualized. Left Kidney: Length: 10.7 cm. Echogenicity within normal limits. No mass or hydronephrosis visualized. Abdominal aorta: No aneurysm visualized. Other findings: Portions of the abdomen are obscured by bowel gas. No ascites visualized. IMPRESSION: 1. Cholelithiasis, gallbladder wall thickening and positive sonographic Murphy's sign consistent with acute cholecystitis. No biliary dilatation. 2. Echogenic liver, likely due to steatosis. 3. No other significant findings. Portions of the abdomen are obscured by bowel gas. Electronically Signed   By: Carey BullocksWilliam  Veazey M.D.   On: 12/03/2015 09:05    Diagnoses that have been ruled out:  None  Diagnoses that are still under consideration:  None  Final diagnoses:  Acute cholecystitis    Patient  has had 2 days of ongoing symptoms, and pain currently, no fever, no elevated white blood cell count, or transaminitis but there is evidence of gallbladder wall thickening and stones with sonographic Murphy sign on ultrasound so concerning for acute cholecystitis. General surgery Dr. Abbey Chattersosenbower was consulted and recommended [facility] for further evaluation. I spoke with Dr. Joycelyn ManZimmerman who accepted the patient transfer. Hemodynamically stable throughout her course here.  Lyndal Pulleyaniel Fredrika Canby, MD 12/03/15 [phone]

## 2015-12-03 NOTE — Progress Notes (Signed)
Dawn RN notified pt will be in 1601 in 20 minutes.  Needs pump and bed.

## 2015-12-03 NOTE — Op Note (Signed)
OPERATIVE NOTE-LAPAROSCOPIC CHOLECYSTECTOMY  Preoperative diagnosis:  Acute calculus cholecystitis  Postoperative diagnosis:  Same  Procedure: Laparoscopic cholecystectomy with cholangiogram.  Surgeon: Avel Peace, M.D.  Asst.:  Violeta Gelinas M.D.  Anesthesia: General  Indication:   This is a 44 year old female who had the onset of severe epigastric pain that woke her up from sleep early Sunday morning. The pain persisted and became worse. She presented for evaluation to the [facility] at [facility]. She had findings consistent with acute cholecystitis by exam and ultrasound. She could not get comfortable with IV analgesic. She was transferred here for definitive treatment.  Technique: She was brought to the operating room, placed supine on the operating table, and a general anesthetic was administered. The abdominal wall was then sterilely prepped and draped. Local anesthetic (Marcaine) was infiltrated in the subumbilical region. A small subumbilical incision was made through the skin, subcutaneous tissue, fascia, and peritoneum entering the peritoneal cavity under direct vision. A pursestring suture of 0 Vicryl was placed around the edges of the fascia. A Hassan trocar was introduced into the peritoneal cavity and a pneumoperitoneum was created by insufflation of carbon dioxide gas. The laparoscope was introduced into the trocar and no underlying bleeding or organ injury was noted. She was then placed in the reverse Trendelenburg position with the right side tilted slightly up.  Three 5 mm trocars were then placed into the abdominal cavity under laparoscopic vision. One in the epigastric area, and 2 in the right upper quadrant area. The gallbladder was visualized.  It was acutely edematous but did not have suppurative or gangrenous changes. The fundus was grasped and retracted toward the right shoulder.  The infundibulum was mobilized with dissection close to the gallbladder and  retracted laterally. The cystic duct was identified and a window was created around it. The cystic artery was also identified and a window was created around it. It was clipped and divided. The critical view was achieved. A clip was placed at the neck of the gallbladder. A small incision was made in the cystic duct. A small stone was milked out of the cystic duct and removed from the peritoneal cavity. A cholangiocatheter was introduced through the anterior abdominal wall and placed in the cystic duct. A intraoperative cholangiogram was then performed.  Under real-time fluoroscopy, dilute contrast was injected into the cystic duct.  The common hepatic duct, the right and left hepatic ducts, and the common duct were all visualized. Contrast drained into the duodenum without obvious evidence of any obstructing ductal lesion. The final report is pending the Radiologist's interpretation.  The cholangiocatheter was removed, the cystic duct was clipped 3 times on the biliary side, and then the cystic duct was divided sharply. No bile leak was noted from the cystic duct stump.  Following this the gallbladder was dissected free from the liver using electrocautery. There was some spillage of bile and a few gallstones during the dissection. The bile was aspirated and the stones were evacuated. I increased the size of the epigastric trocar to an 11 mm in order to do this. The gallbladder was then placed in a retrieval bag and removed from the abdominal cavity through the subumbilical incision.  The gallbladder fossa was inspected, irrigated, and bleeding was controlled with electrocautery. Inspection showed that hemostasis was adequate and there was no evidence of bile leak.  The irrigation fluid was evacuated as much as possible.  The subumbilical trocar was removed and the fascial defect was closed by tightening and tying  down the pursestring suture under laparoscopic vision.  The remaining trocars were removed  and the pneumoperitoneum was released. The skin incisions were closed with 4-0 Monocryl subcuticular stitches. Steri-Strips and sterile dressings were applied.  The procedure was well-tolerated without any apparent complications. She was taken to the recovery room in satisfactory condition.

## 2015-12-03 NOTE — H&P (Signed)
Ashley Nichols is an 44 y.o. female.   Chief Complaint:  HPI:   This is a 44 year old female transferred from [facility] because of acute cholecystitis.   She woke up in the night Saturday with severe pressure type epigastric pain that radiated toward the back. She took an antacid pill and a hot bath and the pain improved but did not completely resolve. Sunday the pain returned and was severe and worsening. It has persisted.  She was seen and diagnosed with acute cholecystitis. Liver function tests are normal white blood cell count is normal both ultrasound and exam are consistent with acute cholecystitis. She was transferred here for further evaluation and treatment.   Past Medical History  Diagnosis Date  . Hypertension   . GERD (gastroesophageal reflux disease)     Past Surgical History  Procedure Laterality Date  . Breast surgery    . Nasal septum surgery  2013    No family history on file. Social History:  has no tobacco, alcohol, and drug history on file.  Allergies:  Allergies  Allergen Reactions  . Ivp Dye [Iodinated Diagnostic Agents] Hives     Prior to Admission medications   Medication Sig Start Date End Date Taking? Authorizing Provider  omeprazole (PRILOSEC) 20 MG capsule Take 20 mg by mouth daily.   Yes Historical Provider, MD  UNKNOWN TO PATIENT    Yes Historical Provider, MD     (Not in a hospital admission)  Results for orders placed or performed during the hospital encounter of 12/03/15 (from the past 48 hour(s))  CBC with Differential     Status: Abnormal   Collection Time: 12/03/15  5:22 AM  Result Value Ref Range   WBC 10.0 4.0 - 10.5 K/uL   RBC 4.40 3.87 - 5.11 MIL/uL   Hemoglobin 10.6 (L) 12.0 - 15.0 g/dL   HCT 33.9 (L) 36.0 - 46.0 %   MCV 77.0 (L) 78.0 - 100.0 fL   MCH 24.1 (L) 26.0 - 34.0 pg   MCHC 31.3 30.0 - 36.0 g/dL   RDW 15.6 (H) 11.5 - 15.5 %   Platelets 461 (H) 150 - 400 K/uL   Neutrophils Relative % 64 %   Neutro Abs 6.3 1.7 - 7.7 K/uL   Lymphocytes Relative 26 %   Lymphs Abs 2.6 0.7 - 4.0 K/uL   Monocytes Relative 9 %   Monocytes Absolute 0.9 0.1 - 1.0 K/uL   Eosinophils Relative 1 %   Eosinophils Absolute 0.1 0.0 - 0.7 K/uL   Basophils Relative 0 %   Basophils Absolute 0.0 0.0 - 0.1 K/uL  Comprehensive metabolic panel     Status: Abnormal   Collection Time: 12/03/15  5:22 AM  Result Value Ref Range   Sodium 138 135 - 145 mmol/L   Potassium 3.3 (L) 3.5 - 5.1 mmol/L   Chloride 104 101 - 111 mmol/L   CO2 27 22 - 32 mmol/L   Glucose, Bld 93 65 - 99 mg/dL   BUN 14 6 - 20 mg/dL   Creatinine, Ser 0.77 0.44 - 1.00 mg/dL   Calcium 9.5 8.9 - 10.3 mg/dL   Total Protein 7.1 6.5 - 8.1 g/dL   Albumin 3.3 (L) 3.5 - 5.0 g/dL   AST 19 15 - 41 U/L   ALT 19 14 - 54 U/L   Alkaline Phosphatase 118 38 - 126 U/L   Total Bilirubin 0.4 0.3 - 1.2 mg/dL   GFR calc non Af Amer >60 >60 mL/min  GFR calc Af Amer >60 >60 mL/min    Comment: (NOTE) The eGFR has been calculated using the CKD EPI equation. This calculation has not been validated in all clinical situations. eGFR's persistently <60 mL/min signify possible Chronic Kidney Disease.    Anion gap 7 5 - 15  Lipase, blood     Status: None   Collection Time: 12/03/15  5:22 AM  Result Value Ref Range   Lipase 31 11 - 51 U/L  Urinalysis, Routine w reflex microscopic     Status: Abnormal   Collection Time: 12/03/15  5:45 AM  Result Value Ref Range   Color, Urine YELLOW YELLOW   APPearance CLOUDY (A) CLEAR   Specific Gravity, Urine 1.010 1.005 - 1.030   pH 8.0 5.0 - 8.0   Glucose, UA NEGATIVE NEGATIVE mg/dL   Hgb urine dipstick NEGATIVE NEGATIVE   Bilirubin Urine NEGATIVE NEGATIVE   Ketones, ur NEGATIVE NEGATIVE mg/dL   Protein, ur NEGATIVE NEGATIVE mg/dL   Nitrite NEGATIVE NEGATIVE   Leukocytes, UA MODERATE (A) NEGATIVE  Pregnancy, urine     Status: None   Collection Time: 12/03/15  5:45 AM  Result Value Ref Range   Preg Test, Ur NEGATIVE NEGATIVE    Comment:        THE  SENSITIVITY OF THIS METHODOLOGY IS >20 mIU/mL.   Urine microscopic-add on     Status: Abnormal   Collection Time: 12/03/15  5:45 AM  Result Value Ref Range   Squamous Epithelial / LPF 6-30 (A) NONE SEEN   WBC, UA 6-30 0 - 5 WBC/hpf   RBC / HPF 0-5 0 - 5 RBC/hpf   Bacteria, UA MANY (A) NONE SEEN   US Abdomen Complete  12/03/2015  CLINICAL DATA:  Epigastric pain with nausea for 1 day. EXAM: ABDOMEN ULTRASOUND COMPLETE COMPARISON:  None. FINDINGS: Gallbladder: There are multiple gallstones as well as sludge in the gallbladder lumen. Gallbladder wall is thickened to 5 mm. There is a sonographic Murphy's sign according to the sonographer. Common bile duct: Diameter: 3 mm Liver: Mildly echogenic without apparent focal abnormality. IVC: No abnormality visualized. Pancreas: Visualized portion unremarkable. Portions of the pancreas are obscured by bowel gas. Spleen: Size and appearance within normal limits. Right Kidney: Length: 10.8 cm. Echogenicity within normal limits. No mass or hydronephrosis visualized. Left Kidney: Length: 10.7 cm. Echogenicity within normal limits. No mass or hydronephrosis visualized. Abdominal aorta: No aneurysm visualized. Other findings: Portions of the abdomen are obscured by bowel gas. No ascites visualized. IMPRESSION: 1. Cholelithiasis, gallbladder wall thickening and positive sonographic Murphy's sign consistent with acute cholecystitis. No biliary dilatation. 2. Echogenic liver, likely due to steatosis. 3. No other significant findings. Portions of the abdomen are obscured by bowel gas. Electronically Signed   By: Richardean Sale M.D.   On: 12/03/2015 09:05    Review of Systems  Constitutional: Negative for fever, chills and weight loss.  Gastrointestinal: Positive for nausea and abdominal pain. Negative for vomiting and diarrhea.  Genitourinary: Negative for dysuria and hematuria.  Neurological: Negative for dizziness and focal weakness.    Blood pressure 134/78,  pulse 70, temperature 98.4 F (36.9 C), temperature source Oral, resp. rate 18, height _0  (1.676 m), weight 97.523 kg (215 lb), last menstrual period 11/12/2015, SpO2 100 %. Physical Exam  Constitutional:  Obese female in no acute distress. Her son is with her.  HENT:  Head: Normocephalic and atraumatic.  Eyes: No scleral icterus.  Cardiovascular: Normal rate and regular rhythm.   Respiratory:  Effort normal and breath sounds normal.  GI: Soft. She exhibits no mass. There is tenderness. There is no guarding. Rebound: right upper quadrant and epigastrium.  obese  Musculoskeletal: She exhibits no edema.  Neurological: She is alert.  Skin: Skin is warm and dry.  Psychiatric: She has a normal mood and affect. Her behavior is normal.     Assessment/Plan Acute calculus cholecystitis.  Plan: Admit. IV antibiotics. Laparoscopic possible open cholecystectomy.I have explained the procedure, risks, and aftercare of cholecystectomy.  Risks include but are not limited to bleeding, infection, wound problems, anesthesia, diarrhea, bile leak, injury to common bile duct/liver/intestine.  She seems to understand and agrees to proceed.   Zeda Gangwer J 12/03/2015, 1:10 PM

## 2015-12-03 NOTE — ED Provider Notes (Signed)
CSN: [id number]     Arrival date & time 12/03/15  0500 History   First MD Initiated Contact with Patient 12/03/15 [phone]     Chief Complaint  Patient presents with  . Abdominal Pain     (Consider location/radiation/quality/duration/timing/severity/associated sxs/prior Treatment) Patient is a 44 y.o. female presenting with abdominal pain. The history is provided by the patient.  Abdominal Pain Associated symptoms: no chest pain, no diarrhea, no nausea, no shortness of breath and no vomiting    patient presents with abdominal pain. Midepigastric area that goes to the back. Began yesterday. Little more severe. No nausea or vomiting. No change with eating. Took some Mylanta that initially helped that her return. No fevers or chills. No diarrhea. Patient has a history some constipation with her nortriptyline. States it is not unusual for emesis but no worse. Pain is dull and constant.  Past Medical History  Diagnosis Date  . Hypertension   . GERD (gastroesophageal reflux disease)    Past Surgical History  Procedure Laterality Date  . Breast surgery    . Nasal septum surgery  2013   No family history on file. Social History  Substance Use Topics  . Smoking status: None  . Smokeless tobacco: None  . Alcohol Use: None   OB History    No data available     Review of Systems  Constitutional: Negative for activity change and appetite change.  Eyes: Negative for pain.  Respiratory: Negative for chest tightness and shortness of breath.   Cardiovascular: Negative for chest pain and leg swelling.  Gastrointestinal: Positive for abdominal pain. Negative for nausea, vomiting and diarrhea.  Genitourinary: Negative for flank pain.  Musculoskeletal: Negative for back pain and neck stiffness.  Skin: Negative for rash.  Neurological: Negative for weakness, numbness and headaches.  Psychiatric/Behavioral: Negative for behavioral problems.      Allergies  Ivp dye  Home Medications    Prior to Admission medications   Medication Sig Start Date End Date Taking? Authorizing Provider  omeprazole (PRILOSEC) 20 MG capsule Take 20 mg by mouth daily.   Yes Historical Provider, MD  UNKNOWN TO PATIENT    Yes Historical Provider, MD   BP 132/79 mmHg  Pulse 87  Temp(Src) 97.5 F (36.4 C) (Oral)  Resp 18  Ht  (1.676 m)  Wt 215 lb (97.523 kg)  BMI 34.72 kg/m2  SpO2 100%  LMP 11/12/2015 Physical Exam  Constitutional: She is oriented to person, place, and time. She appears well-developed and well-nourished.  HENT:  Head: Normocephalic and atraumatic.  Eyes: EOM are normal. Pupils are equal, round, and reactive to light.  Neck: Normal range of motion. Neck supple.  Cardiovascular: Normal rate, regular rhythm and normal heart sounds.   No murmur heard. Pulmonary/Chest: Effort normal and breath sounds normal. No respiratory distress. She has no wheezes. She has no rales.  Abdominal: Soft. Bowel sounds are normal. She exhibits no distension. There is tenderness. There is no rebound and no guarding.  Right upper quadrant tenderness. No rebound or guarding. Mild epigastric tenderness.  Musculoskeletal: Normal range of motion.  Neurological: She is alert and oriented to person, place, and time. No cranial nerve deficit.  Skin: Skin is warm and dry.  Psychiatric: Her speech is normal.  Nursing note and vitals reviewed.   ED Course  Procedures (including critical care time) Labs Review Labs Reviewed  CBC WITH DIFFERENTIAL/PLATELET - Abnormal; Notable for the following:    Hemoglobin 10.6 (*)    HCT 33.9 (*)  MCV 77.0 (*)    MCH 24.1 (*)    RDW 15.6 (*)    Platelets 461 (*)    All other components within normal limits  COMPREHENSIVE METABOLIC PANEL - Abnormal; Notable for the following:    Potassium 3.3 (*)    Albumin 3.3 (*)    All other components within normal limits  URINALYSIS, ROUTINE W REFLEX MICROSCOPIC (NOT AT [facility]) - Abnormal; Notable for the following:     APPearance CLOUDY (*)    Leukocytes, UA MODERATE (*)    All other components within normal limits  URINE MICROSCOPIC-ADD ON - Abnormal; Notable for the following:    Squamous Epithelial / LPF 6-30 (*)    Bacteria, UA MANY (*)    All other components within normal limits  LIPASE, BLOOD  PREGNANCY, URINE    Imaging Review No results found. I have personally reviewed and evaluated these images and lab results as part of my medical decision-making.   EKG Interpretation None      MDM   Final diagnoses:  None    Patient with abdominal pain. Persistent right upper quadrant tenderness. Has possible UTI but no urinary symptoms. We'll get ultrasound to evaluate for gallbladder pathology.    Benjiman CoreNathan Amalie Koran, MD 12/03/15 [phone]

## 2015-12-04 ENCOUNTER — Encounter (HOSPITAL_COMMUNITY): Payer: Self-pay | Admitting: General Surgery

## 2015-12-04 LAB — URINE CULTURE

## 2015-12-04 MED ORDER — OXYCODONE HCL 5 MG PO TABS: 5.0000 mg | ORAL_TABLET | Freq: Four times a day (QID) | ORAL | Status: DC | PRN

## 2015-12-04 NOTE — Discharge Summary (Signed)
Physician Discharge Summary  Patient ID:  Ashley Nichols  MRN: [id number]  DOB/AGE: 1970-12-24 44 y.o.  Admit date: 12/03/2015 Discharge date: 12/04/2015  Discharge Diagnoses:   Active Problems:   Acute cholecystitis  Operation: Procedure(s): LAPAROSCOPIC CHOLECYSTECTOMY WITH INTRAOPERATIVE CHOLANGIOGRAM on 12/03/2015 - [facility]  Discharged Condition: good  Hospital Course: Ashley Nichols is an 44 y.o. female whose primary care physician is Ashley Diego, MD and who was admitted 12/03/2015 with a chief complaint of  Chief Complaint  Patient presents with  . Abdominal Pain  .   She was brought to the operating room on 12/03/2015 and underwent  LAPAROSCOPIC CHOLECYSTECTOMY WITH INTRAOPERATIVE CHOLANGIOGRAM.  She is now one day post op.  She is taking po's well, she is afebrile, and she is ready to go home. Her son is in the room.  The discharge instructions were reviewed with the patient.  Consults: None  Significant Diagnostic Studies: Results for orders placed or performed during the hospital encounter of 12/03/15  CBC with Differential  Result Value Ref Range   WBC 10.0 4.0 - 10.5 K/uL   RBC 4.40 3.87 - 5.11 MIL/uL   Hemoglobin 10.6 (L) 12.0 - 15.0 g/dL   HCT 47.4 (L) 25.9 - 56.3 %   MCV 77.0 (L) 78.0 - 100.0 fL   MCH 24.1 (L) 26.0 - 34.0 pg   MCHC 31.3 30.0 - 36.0 g/dL   RDW 87.5 (H) 64.3 - 32.9 %   Platelets 461 (H) 150 - 400 K/uL   Neutrophils Relative % 64 %   Neutro Abs 6.3 1.7 - 7.7 K/uL   Lymphocytes Relative 26 %   Lymphs Abs 2.6 0.7 - 4.0 K/uL   Monocytes Relative 9 %   Monocytes Absolute 0.9 0.1 - 1.0 K/uL   Eosinophils Relative 1 %   Eosinophils Absolute 0.1 0.0 - 0.7 K/uL   Basophils Relative 0 %   Basophils Absolute 0.0 0.0 - 0.1 K/uL  Comprehensive metabolic panel  Result Value Ref Range   Sodium 138 135 - 145 mmol/L   Potassium 3.3 (L) 3.5 - 5.1 mmol/L   Chloride 104 101 - 111 mmol/L   CO2 27 22 - 32 mmol/L   Glucose, Bld 93 65 - 99 mg/dL    BUN 14 6 - 20 mg/dL   Creatinine, Ser 5.18 0.44 - 1.00 mg/dL   Calcium 9.5 8.9 - 84.1 mg/dL   Total Protein 7.1 6.5 - 8.1 g/dL   Albumin 3.3 (L) 3.5 - 5.0 g/dL   AST 19 15 - 41 U/L   ALT 19 14 - 54 U/L   Alkaline Phosphatase 118 38 - 126 U/L   Total Bilirubin 0.4 0.3 - 1.2 mg/dL   GFR calc non Af Amer >60 >60 mL/min   GFR calc Af Amer >60 >60 mL/min   Anion gap 7 5 - 15  Lipase, blood  Result Value Ref Range   Lipase 31 11 - 51 U/L  Urinalysis, Routine w reflex microscopic  Result Value Ref Range   Color, Urine YELLOW YELLOW   APPearance CLOUDY (A) CLEAR   Specific Gravity, Urine 1.010 1.005 - 1.030   pH 8.0 5.0 - 8.0   Glucose, UA NEGATIVE NEGATIVE mg/dL   Hgb urine dipstick NEGATIVE NEGATIVE   Bilirubin Urine NEGATIVE NEGATIVE   Ketones, ur NEGATIVE NEGATIVE mg/dL   Protein, ur NEGATIVE NEGATIVE mg/dL   Nitrite NEGATIVE NEGATIVE   Leukocytes, UA MODERATE (A) NEGATIVE  Pregnancy, urine  Result Value Ref Range  Preg Test, Ur NEGATIVE NEGATIVE  Urine microscopic-add on  Result Value Ref Range   Squamous Epithelial / LPF 6-30 (A) NONE SEEN   WBC, UA 6-30 0 - 5 WBC/hpf   RBC / HPF 0-5 0 - 5 RBC/hpf   Bacteria, UA MANY (A) NONE SEEN    Dg Cholangiogram Operative  12/03/2015  CLINICAL DATA:  44 year old female with cholelithiasis and cholecystitis EXAM: INTRAOPERATIVE CHOLANGIOGRAM TECHNIQUE: Cholangiographic images from the C-arm fluoroscopic device were submitted for interpretation post-operatively. Please see the procedural report for the amount of contrast and the fluoroscopy time utilized. COMPARISON:  Abdominal ultrasound 12/03/2015 FINDINGS: A single cine loop was obtained during laparoscopic cholecystectomy with intraoperative cholangiogram. The images demonstrate cannulation of the cystic duct remnant with opacification of the biliary tree. No evidence of biliary duct dilatation, stenosis, stricture or choledocholithiasis. Contrast material passes through the ampulla and  into the duodenum. There is reflux of contrast into the main pancreatic duct which is normal along the visualized segment. IMPRESSION: Negative intraoperative cholangiogram. Electronically Signed   By: Malachy Moan M.D.   On: 12/03/2015 15:32   US Abdomen Complete  12/03/2015  CLINICAL DATA:  Epigastric pain with nausea for 1 day. EXAM: ABDOMEN ULTRASOUND COMPLETE COMPARISON:  None. FINDINGS: Gallbladder: There are multiple gallstones as well as sludge in the gallbladder lumen. Gallbladder wall is thickened to 5 mm. There is a sonographic Murphy's sign according to the sonographer. Common bile duct: Diameter: 3 mm Liver: Mildly echogenic without apparent focal abnormality. IVC: No abnormality visualized. Pancreas: Visualized portion unremarkable. Portions of the pancreas are obscured by bowel gas. Spleen: Size and appearance within normal limits. Right Kidney: Length: 10.8 cm. Echogenicity within normal limits. No mass or hydronephrosis visualized. Left Kidney: Length: 10.7 cm. Echogenicity within normal limits. No mass or hydronephrosis visualized. Abdominal aorta: No aneurysm visualized. Other findings: Portions of the abdomen are obscured by bowel gas. No ascites visualized. IMPRESSION: 1. Cholelithiasis, gallbladder wall thickening and positive sonographic Murphy's sign consistent with acute cholecystitis. No biliary dilatation. 2. Echogenic liver, likely due to steatosis. 3. No other significant findings. Portions of the abdomen are obscured by bowel gas. Electronically Signed   By: Carey Bullocks M.D.   On: 12/03/2015 09:05    Discharge Exam:  Filed Vitals:   12/04/15 0204 12/04/15 0600  BP: 114/70 118/63  Pulse: 78 84  Temp: 98 F (36.7 C) 98 F (36.7 C)  Resp: 18 18    General: WN WF who is alert and generally healthy appearing.  Lungs: Clear to auscultation and symmetric breath sounds. Heart:  RRR. No murmur or rub. Abdomen: Soft.  No hernia. Normal bowel sounds.  Incisions are  covered.  Discharge Medications:     Medication List    TAKE these medications        diphenhydrAMINE 25 MG tablet  Commonly known as:  SOMINEX  Take 25 mg by mouth at bedtime.     ibuprofen 600 MG tablet  Commonly known as:  ADVIL,MOTRIN  ONE BY MOUTH EVERY EIGHT HOURS AS NEEDED FOR PAIN     loratadine 10 MG tablet  Commonly known as:  CLARITIN  Take 10 mg by mouth at bedtime.     losartan-hydrochlorothiazide 50-12.5 MG tablet  Commonly known as:  HYZAAR  TAKE 1/4 TABLET BY MOUTH DAILY     norgestimate-ethinyl estradiol 0.25-35 MG-MCG tablet  Commonly known as:  ORTHO-CYCLEN,SPRINTEC,PREVIFEM  Take 1 tablet by mouth at bedtime.     nortriptyline 50 MG capsule  Commonly known as:  PAMELOR  Take 100 mg by mouth at bedtime.     omeprazole 20 MG capsule  Commonly known as:  PRILOSEC  Take 20 mg by mouth daily.     oxyCODONE 5 MG immediate release tablet  Commonly known as:  Oxy IR/ROXICODONE  Take 1-2 tablets (5-10 mg total) by mouth every 6 (six) hours as needed for moderate pain.     RED YEAST RICE PO  Take 2 capsules by mouth daily.     VITAMIN B-12 PO  Take 1 tablet by mouth daily.        Disposition: 01-Home or Self Care      Discharge Instructions    Diet - low sodium heart healthy    Complete by:  As directed      Increase activity slowly    Complete by:  As directed               Signed: Ovidio Kinavid Meggen Spaziani, M.D., [facility]:  [phone]  12/04/2015, 8:36 AM

## 2015-12-04 NOTE — Progress Notes (Signed)
Discharge instructions discussed with patient until all questions answered. Iv discontinued .

## 2017-02-16 ENCOUNTER — Other Ambulatory Visit (HOSPITAL_BASED_OUTPATIENT_CLINIC_OR_DEPARTMENT_OTHER): Payer: Self-pay | Admitting: Family Medicine

## 2019-12-16 ENCOUNTER — Encounter (HOSPITAL_BASED_OUTPATIENT_CLINIC_OR_DEPARTMENT_OTHER): Payer: Self-pay

## 2019-12-16 ENCOUNTER — Other Ambulatory Visit: Payer: Self-pay

## 2019-12-16 ENCOUNTER — Emergency Department (HOSPITAL_BASED_OUTPATIENT_CLINIC_OR_DEPARTMENT_OTHER)
Admission: EM | Admit: 2019-12-16 | Discharge: 2019-12-16 | Disposition: A | Discharge status: Home / Self Care | Payer: Commercial Managed Care - PPO | Attending: Emergency Medicine | Admitting: Emergency Medicine

## 2019-12-16 ENCOUNTER — Emergency Department (HOSPITAL_BASED_OUTPATIENT_CLINIC_OR_DEPARTMENT_OTHER): Payer: Commercial Managed Care - PPO

## 2019-12-16 DIAGNOSIS — K859 Acute pancreatitis without necrosis or infection, unspecified: Secondary | ICD-10-CM | POA: Diagnosis not present

## 2019-12-16 DIAGNOSIS — Z79899 Other long term (current) drug therapy: Secondary | ICD-10-CM | POA: Insufficient documentation

## 2019-12-16 DIAGNOSIS — I10 Essential (primary) hypertension: Secondary | ICD-10-CM | POA: Diagnosis not present

## 2019-12-16 DIAGNOSIS — R1013 Epigastric pain: Secondary | ICD-10-CM | POA: Diagnosis present

## 2019-12-16 LAB — CBC
HCT: 36.5 % (ref 36.0–46.0)
Hemoglobin: 11.4 g/dL — ABNORMAL LOW (ref 12.0–15.0)
MCH: 25.3 pg — ABNORMAL LOW (ref 26.0–34.0)
MCHC: 31.2 g/dL (ref 30.0–36.0)
MCV: 81.1 fL (ref 80.0–100.0)
Platelets: 480 10*3/uL — ABNORMAL HIGH (ref 150–400)
RBC: 4.5 MIL/uL (ref 3.87–5.11)
RDW: 14.3 % (ref 11.5–15.5)
WBC: 15.2 10*3/uL — ABNORMAL HIGH (ref 4.0–10.5)
nRBC: 0 % (ref 0.0–0.2)

## 2019-12-16 LAB — COMPREHENSIVE METABOLIC PANEL
ALT: 17 U/L (ref 0–44)
AST: 18 U/L (ref 15–41)
Albumin: 3.5 g/dL (ref 3.5–5.0)
Alkaline Phosphatase: 130 U/L — ABNORMAL HIGH (ref 38–126)
Anion gap: 10 (ref 5–15)
BUN: 9 mg/dL (ref 6–20)
CO2: 27 mmol/L (ref 22–32)
Calcium: 9.7 mg/dL (ref 8.9–10.3)
Chloride: 98 mmol/L (ref 98–111)
Creatinine, Ser: 0.57 mg/dL (ref 0.44–1.00)
GFR calc Af Amer: >60 mL/min (ref >60)
GFR calc non Af Amer: >60 mL/min (ref >60)
Glucose, Bld: 209 mg/dL — ABNORMAL HIGH (ref 70–99)
Potassium: 3.6 mmol/L (ref 3.5–5.1)
Sodium: 135 mmol/L (ref 135–145)
Total Bilirubin: 0.6 mg/dL (ref 0.3–1.2)
Total Protein: 7.4 g/dL (ref 6.5–8.1)

## 2019-12-16 LAB — URINALYSIS, ROUTINE W REFLEX MICROSCOPIC
Bilirubin Urine: NEGATIVE
Glucose, UA: 250 mg/dL — AB
Hgb urine dipstick: NEGATIVE
Ketones, ur: NEGATIVE mg/dL
Nitrite: NEGATIVE
Protein, ur: NEGATIVE mg/dL
Specific Gravity, Urine: 1.01 (ref 1.005–1.030)
pH: 6.5 (ref 5.0–8.0)

## 2019-12-16 LAB — URINALYSIS, MICROSCOPIC (REFLEX)

## 2019-12-16 LAB — LIPASE, BLOOD: Lipase: 84 U/L — ABNORMAL HIGH (ref 11–51)

## 2019-12-16 LAB — TROPONIN I (HIGH SENSITIVITY): Troponin I (High Sensitivity): 2 ng/L (ref <18)

## 2019-12-16 LAB — PREGNANCY, URINE: Preg Test, Ur: NEGATIVE

## 2019-12-16 MED ORDER — ONDANSETRON 4 MG PO TBDP
4.0000 mg | ORAL_TABLET | Freq: Once | ORAL | Status: AC
  Administered 2019-12-16: 22:00:00 4 mg via ORAL
  Filled 2019-12-16: qty 1

## 2019-12-16 MED ORDER — ONDANSETRON 4 MG PO TBDP: 4.0000 mg | ORAL_TABLET | Freq: Three times a day (TID) | ORAL | 0 refills | Status: DC | PRN

## 2019-12-16 MED ORDER — OXYCODONE-ACETAMINOPHEN 5-325 MG PO TABS: 2.0000 | ORAL_TABLET | Freq: Four times a day (QID) | ORAL | 0 refills | Status: AC | PRN

## 2019-12-16 MED ORDER — OXYCODONE-ACETAMINOPHEN 5-325 MG PO TABS
2.0000 | ORAL_TABLET | Freq: Once | ORAL | Status: AC
  Administered 2019-12-16: 2 via ORAL
  Filled 2019-12-16: qty 2

## 2019-12-16 MED ORDER — ONDANSETRON 4 MG PO TBDP
4.0000 mg | ORAL_TABLET | Freq: Once | ORAL | Status: AC | PRN
  Administered 2019-12-16: 17:00:00 4 mg via ORAL
  Filled 2019-12-16: qty 1

## 2019-12-16 MED ORDER — OXYCODONE-ACETAMINOPHEN 5-325 MG PO TABS
1.0000 | ORAL_TABLET | Freq: Once | ORAL | Status: AC
  Administered 2019-12-16: 22:00:00 1 via ORAL
  Filled 2019-12-16: qty 1

## 2019-12-16 NOTE — ED Triage Notes (Signed)
Pt c/o epigastric pain that started Sunday. Pt reports nausea and diarrhea. Pain has been constant and is unchanged by eating.

## 2019-12-16 NOTE — ED Provider Notes (Signed)
[facility] EMERGENCY DEPARTMENT Provider Note   CSN: [id number] Arrival date & time: 12/16/19  1647     History Chief Complaint  Patient presents with  . Abdominal Pain    Ashley Nichols is a 49 y.o. female.  49 year old female with past medical history including hypertension, GERD, cholecystectomy who presents with abdominal pain. 5 days ago, she began having epigastric abdominal pain that intermittently radiates to the left upper quadrant as well as to her back.  Pain has been constant.  It is sometimes worse when she tries to sit up and move around.  No major change with eating.  She has had nausea but no vomiting.  She denies any diarrhea, fevers, URI symptoms, urinary symptoms.  She has distant history of similar pain when she had gallstones but she has since had a cholecystectomy.  Denies alcohol use.  The history is provided by the patient.  Abdominal Pain      Past Medical History:  Diagnosis Date  . GERD (gastroesophageal reflux disease)   . Hypertension     Patient Active Problem List   Diagnosis Date Noted  . Acute cholecystitis 12/03/2015    Past Surgical History:  Procedure Laterality Date  . BREAST SURGERY    . CHOLECYSTECTOMY N/A 12/03/2015   Procedure: LAPAROSCOPIC CHOLECYSTECTOMY WITH INTRAOPERATIVE CHOLANGIOGRAM;  Surgeon: Avel Peace, MD;  Location: [facility];  Service: General;  Laterality: N/A;  . NASAL SEPTUM SURGERY  2013     OB History   No obstetric history on file.     No family history on file.  Social History   Tobacco Use  . Smoking status: Never Smoker  . Smokeless tobacco: Never Used  Substance Use Topics  . Alcohol use: No  . Drug use: No    Home Medications Prior to Admission medications   Medication Sig Start Date End Date Taking? Authorizing Provider  baclofen (LIORESAL) 10 MG tablet Take by mouth. 12/12/19  Yes [provider]  hydrochlorothiazide (HYDRODIURIL) 12.5 MG tablet TAKE 1 TABLET BY MOUTH  EVERY DAY 11/24/19  Yes [provider]  ketorolac (TORADOL) 10 MG tablet Take by mouth. 12/28/18  Yes [provider]  nortriptyline (PAMELOR) 50 MG capsule Take by mouth. 12/12/19  Yes [provider]  Cyanocobalamin (VITAMIN B-12 PO) Take 1 tablet by mouth daily.    [provider]  diphenhydrAMINE (SOMINEX) 25 MG tablet Take 25 mg by mouth at bedtime.    [provider]  ibuprofen (ADVIL,MOTRIN) 600 MG tablet ONE BY MOUTH EVERY EIGHT HOURS AS NEEDED FOR PAIN 11/07/15   [provider]  loratadine (CLARITIN) 10 MG tablet Take 10 mg by mouth at bedtime.    [provider]  losartan-hydrochlorothiazide (HYZAAR) 50-12.5 MG tablet TAKE 1/4 TABLET BY MOUTH DAILY 10/23/15   [provider]  methocarbamol (ROBAXIN) 750 MG tablet Take 750 mg by mouth 2 (two) times daily. 10/28/19   [provider]  norgestimate-ethinyl estradiol (ORTHO-CYCLEN,SPRINTEC,PREVIFEM) 0.25-35 MG-MCG tablet Take 1 tablet by mouth at bedtime.    [provider]  nortriptyline (PAMELOR) 50 MG capsule Take 100 mg by mouth at bedtime.    [provider]  omeprazole (PRILOSEC) 20 MG capsule Take 20 mg by mouth daily.    [provider]  ondansetron (ZOFRAN ODT) 4 MG disintegrating tablet Take 1 tablet (4 mg total) by mouth every 8 (eight) hours as needed for nausea or vomiting. 12/16/19   Kweku Stankey, Ambrose Finland, MD  oxyCODONE (OXY IR/ROXICODONE)  5 MG immediate release tablet Take 1-2 tablets (5-10 mg total) by mouth every 6 (six) hours as needed for moderate pain. 12/04/15   Alphonsa Overall, MD  oxyCODONE-acetaminophen (PERCOCET) 5-325 MG tablet Take 2 tablets by mouth every 6 (six) hours as needed for up to 5 days. 12/16/19 12/21/19  Reyne Falconi, Wenda Overland, MD  Red Yeast Rice Extract (RED YEAST RICE PO) Take 2 capsules by mouth daily.    [provider]    Allergies    Ivp dye [iodinated diagnostic agents]  Review of Systems    Review of Systems  Gastrointestinal: Positive for abdominal pain.   All other systems reviewed and are negative except that which was mentioned in HPI  Physical Exam Updated Vital Signs BP (!) 149/92 (BP Location: Right Arm)   Pulse 86   Temp 98.3 F (36.8 C) (Oral)   Resp 20   Ht 5\' 7"  (1.702 m)   Wt 104.3 kg   LMP 12/01/2019   SpO2 99%   BMI 36.02 kg/m   Physical Exam Vitals and nursing note reviewed.  Constitutional:      General: She is not in acute distress.    Appearance: She is well-developed.  HENT:     Head: Normocephalic and atraumatic.  Eyes:     Conjunctiva/sclera: Conjunctivae normal.  Cardiovascular:     Rate and Rhythm: Normal rate and regular rhythm.     Heart sounds: Normal heart sounds. No murmur.  Pulmonary:     Effort: Pulmonary effort is normal.     Breath sounds: Normal breath sounds.  Abdominal:     General: Bowel sounds are normal. There is no distension.     Palpations: Abdomen is soft.     Tenderness: There is abdominal tenderness in the epigastric area and left upper quadrant.  Musculoskeletal:     Cervical back: Neck supple.  Skin:    General: Skin is warm and dry.  Neurological:     Mental Status: She is alert and oriented to person, place, and time.     Comments: Fluent speech  Psychiatric:        Judgment: Judgment normal.     ED Results / Procedures / Treatments   Labs (all labs ordered are listed, but only abnormal results are displayed) Labs Reviewed  LIPASE, BLOOD - Abnormal; Notable for the following components:      Result Value   Lipase 84 (*)    All other components within normal limits  COMPREHENSIVE METABOLIC PANEL - Abnormal; Notable for the following components:   Glucose, Bld 209 (*)    Alkaline Phosphatase 130 (*)    All other components within normal limits  CBC - Abnormal; Notable for the following components:   WBC 15.2 (*)    Hemoglobin 11.4 (*)    MCH 25.3 (*)    Platelets 480 (*)    All other  components within normal limits  URINALYSIS, ROUTINE W REFLEX MICROSCOPIC - Abnormal; Notable for the following components:   Glucose, UA 250 (*)    Leukocytes,Ua TRACE (*)    All other components within normal limits  URINALYSIS, MICROSCOPIC (REFLEX) - Abnormal; Notable for the following components:   Bacteria, UA FEW (*)    All other components within normal limits  PREGNANCY, URINE  TROPONIN I (HIGH SENSITIVITY)    EKG EKG Interpretation  Date/Time:  Friday December 16 2019 17:01:49 EST Ventricular Rate:  95 PR Interval:  144 QRS Duration: 86 QT Interval:  342 QTC Calculation:  429 R Axis:   13 Text Interpretation: Normal sinus rhythm Normal ECG No previous ECGs available Confirmed by Frederick Peers [phone]) on 12/16/2019 6:58:19 PM   Radiology CT Abdomen Pelvis Wo Contrast  Result Date: 12/16/2019 CLINICAL DATA:  Upper abdominal pain, elevated lipase EXAM: CT ABDOMEN AND PELVIS WITHOUT CONTRAST TECHNIQUE: Multidetector CT imaging of the abdomen and pelvis was performed following the standard protocol without IV contrast. COMPARISON:  Abdominal ultrasound December 03, 2019 FINDINGS: Lower chest: The visualized heart size within normal limits. No pericardial fluid/thickening. No hiatal hernia. The visualized portions of the lungs are clear. Hepatobiliary: Although limited due to the lack of intravenous contrast, normal in appearance without gross focal abnormality. The patient is status post cholecystectomy. No biliary ductal dilation. Pancreas: There is mesenteric fat stranding changes seen diffusely around the entirety of the pancreas. No definite loculated fluid collections are seen. No free air is noted surrounding the pancreas. Spleen: Normal in size. Although limited due to the lack of intravenous contrast, normal in appearance. Adrenals/Urinary Tract: Both adrenal glands appear normal. The kidneys and collecting system appear normal without evidence of urinary tract calculus or  hydronephrosis. Bladder is unremarkable. Stomach/Bowel: The stomach, small bowel, and colon are normal in appearance. No inflammatory changes or obstructive findings. Scattered colonic diverticula are noted. Appendix is normal. Vascular/Lymphatic: There are no enlarged abdominal or pelvic lymph nodes. No significant gross vascular findings are present. Reproductive: The uterus and adnexa are unremarkable. Other: No evidence of abdominal wall mass or hernia. Musculoskeletal: No acute or significant osseous findings. IMPRESSION: Diffuse moderate acute pancreatitis. No definite adjacent pseudocyst or pancreatic ductal dilatation. Diverticulosis without diverticulitis. Electronically Signed   By: Jonna Clark M.D.   On: 12/16/2019 20:39    Procedures Procedures (including critical care time)  Medications Ordered in ED Medications  oxyCODONE-acetaminophen (PERCOCET/ROXICET) 5-325 MG per tablet 1 tablet (has no administration in time range)  ondansetron (ZOFRAN-ODT) disintegrating tablet 4 mg (has no administration in time range)  ondansetron (ZOFRAN-ODT) disintegrating tablet 4 mg (4 mg Oral Given 12/16/19 1705)  oxyCODONE-acetaminophen (PERCOCET/ROXICET) 5-325 MG per tablet 2 tablet (2 tablets Oral Given 12/16/19 2022)    ED Course  I have reviewed the triage vital signs and the nursing notes.  Pertinent labs & imaging results that were available during my care of the patient were reviewed by me and considered in my medical decision making (see chart for details).    MDM Rules/Calculators/A&P                      Nontoxic on exam, afebrile.  Upper abdominal tenderness noted.  UA without evidence of infection.  Lab work shows blood glucose 209, normal creatinine, normal LFTs and bilirubin, lipase 84, WBC 15.2, normal troponin.  EKG without ischemic changes and no other symptoms to suggest ACS.  Because of the patient's leukocytosis and mildly elevated lipase, obtain CT which confirms acute  pancreatitis.  No other acute findings.  Patient has been tolerating p.o., gave Zofran and Percocet here.  I feel she is appropriate for outpatient management.  Counseled the patient on dietary changes and close PCP follow-up for reassessment.  Also counseled on avoidance of alcohol.  I have reviewed return precautions and she voiced understanding. Final Clinical Impression(s) / ED Diagnoses Final diagnoses:  Acute pancreatitis without infection or necrosis, unspecified pancreatitis type    Rx / DC Orders ED Discharge Orders         Ordered    ondansetron [facility]  ODT) 4 MG disintegrating tablet  Every 8 hours PRN     12/16/19 2203    oxyCODONE-acetaminophen (PERCOCET) 5-325 MG tablet  Every 6 hours PRN     12/16/19 2203           Eunice Oldaker, Ambrose Finland, MD 12/16/19 2254

## 2020-05-18 ENCOUNTER — Other Ambulatory Visit (HOSPITAL_BASED_OUTPATIENT_CLINIC_OR_DEPARTMENT_OTHER): Payer: Self-pay | Admitting: Family Medicine

## 2020-05-18 DIAGNOSIS — E213 Hyperparathyroidism, unspecified: Secondary | ICD-10-CM

## 2020-05-23 ENCOUNTER — Ambulatory Visit (HOSPITAL_BASED_OUTPATIENT_CLINIC_OR_DEPARTMENT_OTHER)
Admission: RE | Admit: 2020-05-23 | Discharge: 2020-05-23 | Disposition: A | Discharge status: Home / Self Care | Payer: Commercial Managed Care - PPO | Source: Ambulatory Visit | Attending: Family Medicine | Admitting: Family Medicine

## 2020-05-23 ENCOUNTER — Other Ambulatory Visit: Payer: Self-pay

## 2020-05-23 DIAGNOSIS — E213 Hyperparathyroidism, unspecified: Secondary | ICD-10-CM | POA: Diagnosis present

## 2020-10-29 ENCOUNTER — Other Ambulatory Visit: Payer: Self-pay | Admitting: Family Medicine

## 2020-10-29 DIAGNOSIS — E236 Other disorders of pituitary gland: Secondary | ICD-10-CM

## 2020-11-05 ENCOUNTER — Ambulatory Visit (INDEPENDENT_AMBULATORY_CARE_PROVIDER_SITE_OTHER): Payer: Commercial Managed Care - PPO

## 2020-11-05 ENCOUNTER — Other Ambulatory Visit: Payer: Self-pay

## 2020-11-05 ENCOUNTER — Encounter (INDEPENDENT_AMBULATORY_CARE_PROVIDER_SITE_OTHER): Payer: Self-pay

## 2020-11-05 DIAGNOSIS — G43709 Chronic migraine without aura, not intractable, without status migrainosus: Secondary | ICD-10-CM

## 2020-11-05 DIAGNOSIS — H5713 Ocular pain, bilateral: Secondary | ICD-10-CM

## 2020-11-05 DIAGNOSIS — E236 Other disorders of pituitary gland: Secondary | ICD-10-CM

## 2020-11-05 MED ORDER — GADOBUTROL 1 MMOL/ML IV SOLN
10.0000 mL | Freq: Once | INTRAVENOUS | Status: AC | PRN
  Administered 2020-11-05: 10 mL via INTRAVENOUS

## 2020-11-13 ENCOUNTER — Other Ambulatory Visit: Payer: Self-pay | Admitting: Family Medicine

## 2020-11-13 ENCOUNTER — Other Ambulatory Visit (HOSPITAL_COMMUNITY): Payer: Self-pay | Admitting: Family Medicine

## 2020-11-13 DIAGNOSIS — E0590 Thyrotoxicosis, unspecified without thyrotoxic crisis or storm: Secondary | ICD-10-CM

## 2020-11-20 ENCOUNTER — Other Ambulatory Visit: Payer: Self-pay | Admitting: Physician Assistant

## 2020-11-20 DIAGNOSIS — K859 Acute pancreatitis without necrosis or infection, unspecified: Secondary | ICD-10-CM

## 2020-12-17 ENCOUNTER — Other Ambulatory Visit: Payer: Self-pay | Admitting: Family Medicine

## 2020-12-17 DIAGNOSIS — R946 Abnormal results of thyroid function studies: Secondary | ICD-10-CM

## 2020-12-19 ENCOUNTER — Ambulatory Visit
Admission: RE | Admit: 2020-12-19 | Discharge: 2020-12-19 | Disposition: A | Discharge status: Home / Self Care | Payer: Commercial Managed Care - PPO | Source: Ambulatory Visit | Attending: Family Medicine | Admitting: Family Medicine

## 2020-12-19 ENCOUNTER — Other Ambulatory Visit: Payer: Self-pay

## 2020-12-19 ENCOUNTER — Ambulatory Visit
Admission: RE | Admit: 2020-12-19 | Discharge: 2020-12-19 | Disposition: A | Discharge status: Home / Self Care | Payer: Commercial Managed Care - PPO | Source: Ambulatory Visit | Attending: Physician Assistant | Admitting: Physician Assistant

## 2020-12-19 DIAGNOSIS — R946 Abnormal results of thyroid function studies: Secondary | ICD-10-CM

## 2020-12-19 DIAGNOSIS — K859 Acute pancreatitis without necrosis or infection, unspecified: Secondary | ICD-10-CM

## 2020-12-19 MED ORDER — GADOBENATE DIMEGLUMINE 529 MG/ML IV SOLN
20.0000 mL | Freq: Once | INTRAVENOUS | Status: AC | PRN
  Administered 2020-12-19: 20 mL via INTRAVENOUS

## 2021-01-28 ENCOUNTER — Encounter: Payer: Self-pay | Admitting: Genetic Counselor

## 2021-01-28 ENCOUNTER — Inpatient Hospital Stay: Payer: Commercial Managed Care - PPO | Attending: Genetic Counselor | Admitting: Genetic Counselor

## 2021-01-28 DIAGNOSIS — Z803 Family history of malignant neoplasm of breast: Secondary | ICD-10-CM

## 2021-01-28 DIAGNOSIS — N200 Calculus of kidney: Secondary | ICD-10-CM

## 2021-01-28 DIAGNOSIS — Z808 Family history of malignant neoplasm of other organs or systems: Secondary | ICD-10-CM

## 2021-01-28 DIAGNOSIS — E213 Hyperparathyroidism, unspecified: Secondary | ICD-10-CM | POA: Diagnosis not present

## 2021-01-28 NOTE — Progress Notes (Signed)
REFERRING PROVIDER: Vladimir Crofts, PA-C [address] [place],  [place] [address]  PRIMARY PROVIDER:  Francine Graven, MD  PRIMARY REASON FOR VISIT:  1. Family history of thyroid cancer   2. Family history of breast cancer   3. Kidney stones   4. Hyperparathyroidism ([place])      HISTORY OF PRESENT ILLNESS:   Ms. Ashley Nichols, a 50 y.o. female, was seen for a [place] cancer genetics consultation at the request of Jamekia [employer] due to a personal history of hyperparathyroidism, multiple bouts of pancreatitis, and kidney stones, and a family history of breast and thyroid cancer.  Ms. [name] presents to clinic today to discuss the possibility of a hereditary predisposition to cancer, genetic testing, and to further clarify her future cancer risks, as well as potential cancer risks for family members.   Ms. [name] is a 50 y.o. female with no personal history of cancer.    CANCER HISTORY:  Oncology History   No history exists.     RISK FACTORS:  Menarche was at age 50.  First live birth at age 48.  OCP use for approximately 29 years.  Ovaries intact: yes.  Hysterectomy: no.  Menopausal status: perimenopausal.  HRT use: 0 years. Colonoscopy: no; scheduled for next week. Mammogram within the last year: yes. Number of breast biopsies: 1. Up to date with pelvic exams: yes. Any excessive radiation exposure in the past: no  Past Medical History:  Diagnosis Date  . Family history of breast cancer   . Family history of thyroid cancer   . GERD (gastroesophageal reflux disease)   . Hypertension   . Kidney stones     Past Surgical History:  Procedure Laterality Date  . BREAST SURGERY    . CHOLECYSTECTOMY N/A 12/03/2015   Procedure: LAPAROSCOPIC CHOLECYSTECTOMY WITH INTRAOPERATIVE CHOLANGIOGRAM;  Surgeon: Jackolyn Confer, MD;  Location: [facility];  Service: General;  Laterality: N/A;  . NASAL SEPTUM SURGERY  2013    Social History   Socioeconomic History  . Marital  status: Married    Spouse name: Not on file  . Number of children: Not on file  . Years of education: Not on file  . Highest education level: Not on file  Occupational History  . Not on file  Tobacco Use  . Smoking status: Never Smoker  . Smokeless tobacco: Never Used  Vaping Use  . Vaping Use: Never used  Substance and Sexual Activity  . Alcohol use: No  . Drug use: No  . Sexual activity: Yes  Other Topics Concern  . Not on file  Social History Narrative  . Not on file   Social Determinants of Health   Financial Resource Strain: Not on file  Food Insecurity: Not on file  Transportation Needs: Not on file  Physical Activity: Not on file  Stress: Not on file  Social Connections: Not on file     FAMILY HISTORY:  We obtained a detailed, 4-generation family history.  Significant diagnoses are listed below: Family History  Problem Relation Age of Onset  . Lung cancer Father 49  . Thyroid cancer Maternal Aunt        dx over 62  . Lung cancer Maternal Uncle   . Breast cancer Paternal Grandmother 59  . Lung cancer Paternal Grandfather   . Breast cancer Other        MGMs mother    The patient has one son who is cancer free.  She has three brothers, who  are cancer free.  Her mother is living and her father is deceased.  The patient's mother is living.  She had 11 siblings.  Two sisters had thyroid cancer over age 63, one brother had lung cancer.  There is no other report of cancer in the family.  The patient's father died of lung cancer at 79. He had two sisters and four brothers who are cancer free.  His mother had breast cancer around age 63, and her mother also had breast cancer.  Ms. Langham is unaware of previous family history of genetic testing for hereditary cancer risks. Patient's maternal ancestors are of [place] and [place] descent, and paternal ancestors are of [place] and [place] descent. There is no reported Ashkenazi Jewish ancestry. There is no known  consanguinity.    GENETIC COUNSELING ASSESSMENT: [name] is a 50 y.o. female with a personal and family history of cancer which is somewhat suggestive of a hereditary cancer syndrome and predisposition to cancer given her personal history of pancreatitis, hyperparathyroidism and kidney stones, in addition to the family history of breast cancer. We, therefore, discussed and recommended the following at today's visit.   DISCUSSION: We discussed that 5 - 10% of breast cancer is hereditary, with most cases associated with BRCA mutations.  There are other genes that can be associated with hereditary breast cancer syndromes.  These include ATM, PALB2 and CHEK2.  We discussed that testing is beneficial for several reasons including knowing how to follow individuals after completing their treatment, identifying whether potential treatment options such as PARP inhibitors would be beneficial, and understand if other family members could be at risk for cancer and allow them to undergo genetic testing.   Additionally, the combination of pancreatitis and hyperparathyroidism can sometimes be seen in MEN1.  MEN1 is a endocrine condition, that can run in families.  Her family does have some of the symptoms associated with this condition with sweating issues in her brother, thyroid cancer of unknown pathology in her two maternal aunts, and her symptoms as well.  This is caused by genetic mutations in the MEN1 gene.  We reviewed the characteristics, features and inheritance patterns of hereditary cancer syndromes. We also discussed genetic testing, including the appropriate family members to test, the process of testing, insurance coverage and turn-around-time for results. We discussed the implications of a negative, positive, carrier and/or variant of uncertain significant result. We recommended [name] pursue genetic testing for the multi cancer gene panel with pancreatitis genes.   Based on [name]'s personal and  family history of cancer, she meets medical criteria for genetic testing. Despite that she meets criteria, she may still have an out of pocket cost. We discussed that if her out of pocket cost for testing is over $100, the laboratory will call and confirm whether she wants to proceed with testing.  If the out of pocket cost of testing is less than $100 she will be billed by the genetic testing laboratory.   PLAN: After considering the risks, benefits, and limitations, Ms. Dancer provided informed consent to pursue genetic testing and the blood sample was sent to [facility] for analysis of the multi-cancer panel with pancreatitis genes. Results should be available within approximately 2-3 weeks' time, at which point they will be disclosed by telephone to Ms. Lavina Hamman, as will any additional recommendations warranted by these results. Ms. Kolinski will receive a summary of her genetic counseling visit and a copy of her results once available. This information will also be  available in [place].   Lastly, we encouraged Ms. Verstraete to remain in contact with cancer genetics annually so that we can continuously update the family history and inform her of any changes in cancer genetics and testing that may be of benefit for this family.   Ms. Batte questions were answered to her satisfaction today. Our contact information was provided should additional questions or concerns arise. Thank you for the referral and allowing [place] to share in the care of your patient.   Aianna Fahs P. Florene [place], [place], [facility] Licensed, Insurance risk surveyor [email or web address] phone: [phone]  The patient was seen for a total of 45 minutes in face-to-face genetic counseling.  This patient was discussed with Drs. Magrinat, Lindi Adie and/or Burr Medico who agrees with the above.    _______________________________________________________________________ For Office Staff:  Number of people involved in session: 1 Was an Intern/ student  involved with case: yes [employer]

## 2021-02-01 ENCOUNTER — Other Ambulatory Visit: Payer: Self-pay | Admitting: Genetic Counselor

## 2021-02-01 DIAGNOSIS — Z803 Family history of malignant neoplasm of breast: Secondary | ICD-10-CM

## 2021-02-08 ENCOUNTER — Other Ambulatory Visit: Payer: Self-pay

## 2021-02-08 ENCOUNTER — Inpatient Hospital Stay: Payer: Commercial Managed Care - PPO | Attending: Genetic Counselor

## 2021-02-08 DIAGNOSIS — Z803 Family history of malignant neoplasm of breast: Secondary | ICD-10-CM

## 2021-02-08 LAB — GENETIC SCREENING ORDER

## 2021-02-13 ENCOUNTER — Other Ambulatory Visit: Payer: Commercial Managed Care - PPO

## 2021-02-18 ENCOUNTER — Encounter: Payer: Self-pay | Admitting: Genetic Counselor

## 2021-02-18 ENCOUNTER — Telehealth: Payer: Self-pay | Admitting: Genetic Counselor

## 2021-02-18 DIAGNOSIS — Z1379 Encounter for other screening for genetic and chromosomal anomalies: Secondary | ICD-10-CM | POA: Insufficient documentation

## 2021-02-18 NOTE — Telephone Encounter (Signed)
Revealed negative genetic testing.  Discussed that we do not know why she has pancreatitis or why there is cancer in the family. It could be due to a different gene that we are not testing, or maybe our current technology may not be able to pick something up.  It will be important for her to keep in contact with genetics to keep up with whether additional testing may be needed.

## 2021-02-19 ENCOUNTER — Ambulatory Visit: Payer: Self-pay | Admitting: Genetic Counselor

## 2021-02-19 DIAGNOSIS — Z1379 Encounter for other screening for genetic and chromosomal anomalies: Secondary | ICD-10-CM

## 2021-02-19 NOTE — Progress Notes (Signed)
HPI:  Ms. Stanczak was previously seen in the [facility] due to a family history of cancer and concerns regarding a hereditary predisposition to cancer. Please refer to our prior cancer genetics clinic note for more information regarding our discussion, assessment and recommendations, at the time. Ms. Henkes recent genetic test results were disclosed to her, as were recommendations warranted by these results. These results and recommendations are discussed in more detail below.  CANCER HISTORY:  Oncology History   No history exists.    FAMILY HISTORY:  We obtained a detailed, 4-generation family history.  Significant diagnoses are listed below: Family History  Problem Relation Age of Onset  . Lung cancer Father 44  . Thyroid cancer Maternal Aunt        dx over 50  . Lung cancer Maternal Uncle   . Breast cancer Paternal Grandmother 62  . Lung cancer Paternal Grandfather   . Breast cancer Other        MGMs mother    The patient has one son who is cancer free.  She has three brothers, who are cancer free.  Her mother is living and her father is deceased.  The patient's mother is living.  She had 11 siblings.  Two sisters had thyroid cancer over age 35, one brother had lung cancer.  There is no other report of cancer in the family.  The patient's father died of lung cancer at 57. He had two sisters and four brothers who are cancer free.  His mother had breast cancer around age 63, and her mother also had breast cancer.  Ms. Plummer is unaware of previous family history of genetic testing for hereditary cancer risks. Patient's maternal ancestors are of [place] and [place] descent, and paternal ancestors are of [place] and [place] descent. There is no reported Ashkenazi Jewish ancestry. There is no known consanguinity.     GENETIC TEST RESULTS: Genetic testing reported out on February 18, 2021 through the Multi-cancer and pancreatitis panel found no pathogenic mutations. The  test report has been scanned into EPIC and is located under the Molecular Pathology section of the Results Review tab.  A portion of the result report is included below for reference.     We discussed with Ms. Cost that because current genetic testing is not perfect, it is possible there may be a gene mutation in one of these genes that current testing cannot detect, but that chance is small.  We also discussed, that there could be another gene that has not yet been discovered, or that we have not yet tested, that is responsible for the cancer diagnoses in the family. It is also possible there is a hereditary cause for the cancer in the family that Ms. Grabinski did not inherit and therefore was not identified in her testing.  Therefore, it is important to remain in touch with cancer genetics in the future so that we can continue to offer Ms. Schreiter the most up to date genetic testing.   ADDITIONAL GENETIC TESTING: We discussed with Ms. Ayars that her genetic testing was fairly extensive.  If there are genes identified to increase cancer risk that can be analyzed in the future, we would be happy to discuss and coordinate this testing at that time.    CANCER SCREENING RECOMMENDATIONS: Ms. Gains test result is considered negative (normal).  This means that we have not identified a hereditary cause for her family history of cancer at this time. Most cancers happen by  chance and this negative test suggests that her cancer may fall into this category.    While reassuring, this does not definitively rule out a hereditary predisposition to cancer. It is still possible that there could be genetic mutations that are undetectable by current technology. There could be genetic mutations in genes that have not been tested or identified to increase cancer risk.  Therefore, it is recommended she continue to follow the cancer management and screening guidelines provided by her primary healthcare provider.   An  individual's cancer risk and medical management are not determined by genetic test results alone. Overall cancer risk assessment incorporates additional factors, including personal medical history, family history, and any available genetic information that may result in a personalized plan for cancer prevention and surveillance  RECOMMENDATIONS FOR FAMILY MEMBERS:  Individuals in this family might be at some increased risk of developing cancer, over the general population risk, simply due to the family history of cancer.  We recommended women in this family have a yearly mammogram beginning at age 48, or 74 years younger than the earliest onset of cancer, an annual clinical breast exam, and perform monthly breast self-exams. Women in this family should also have a gynecological exam as recommended by their primary provider. All family members should be referred for colonoscopy starting at age 14.  FOLLOW-UP: Lastly, we discussed with Ms. Duling that cancer genetics is a rapidly advancing field and it is possible that new genetic tests will be appropriate for her and/or her family members in the future. We encouraged her to remain in contact with cancer genetics on an annual basis so we can update her personal and family histories and let her know of advances in cancer genetics that may benefit this family.   Our contact number was provided. Ms. Grupp questions were answered to her satisfaction, and she knows she is welcome to call us at anytime with additional questions or concerns.   Maylon Cos, MS, [facility] Licensed, Certified Genetic Counselor [email or web address]

## 2021-03-21 ENCOUNTER — Other Ambulatory Visit: Payer: Self-pay | Admitting: Physician Assistant

## 2021-03-21 DIAGNOSIS — K859 Acute pancreatitis without necrosis or infection, unspecified: Secondary | ICD-10-CM

## 2021-05-21 ENCOUNTER — Ambulatory Visit
Admission: RE | Admit: 2021-05-21 | Discharge: 2021-05-21 | Disposition: A | Discharge status: Home / Self Care | Payer: Commercial Managed Care - PPO | Source: Ambulatory Visit | Attending: Physician Assistant | Admitting: Physician Assistant

## 2021-05-21 DIAGNOSIS — K859 Acute pancreatitis without necrosis or infection, unspecified: Secondary | ICD-10-CM

## 2021-05-21 MED ORDER — GADOBENATE DIMEGLUMINE 529 MG/ML IV SOLN
15.0000 mL | Freq: Once | INTRAVENOUS | Status: AC | PRN
  Administered 2021-05-21: 15 mL via INTRAVENOUS

## 2021-11-05 ENCOUNTER — Other Ambulatory Visit: Payer: Self-pay

## 2021-11-05 ENCOUNTER — Encounter (HOSPITAL_COMMUNITY): Payer: Self-pay

## 2021-11-05 ENCOUNTER — Emergency Department (HOSPITAL_COMMUNITY)
Admission: EM | Admit: 2021-11-05 | Discharge: 2021-11-05 | Disposition: A | Discharge status: Home / Self Care | Payer: Commercial Managed Care - PPO | Attending: Emergency Medicine | Admitting: Emergency Medicine

## 2021-11-05 DIAGNOSIS — Z79899 Other long term (current) drug therapy: Secondary | ICD-10-CM | POA: Insufficient documentation

## 2021-11-05 DIAGNOSIS — I10 Essential (primary) hypertension: Secondary | ICD-10-CM | POA: Insufficient documentation

## 2021-11-05 DIAGNOSIS — K859 Acute pancreatitis without necrosis or infection, unspecified: Secondary | ICD-10-CM | POA: Insufficient documentation

## 2021-11-05 LAB — CBC WITH DIFFERENTIAL/PLATELET
Abs Immature Granulocytes: 0.06 10*3/uL (ref 0.00–0.07)
Basophils Absolute: 0 10*3/uL (ref 0.0–0.1)
Basophils Relative: 0 %
Eosinophils Absolute: 0.2 10*3/uL (ref 0.0–0.5)
Eosinophils Relative: 1 %
HCT: 38.6 % (ref 36.0–46.0)
Hemoglobin: 12.3 g/dL (ref 12.0–15.0)
Immature Granulocytes: 0 %
Lymphocytes Relative: 11 %
Lymphs Abs: 1.4 10*3/uL (ref 0.7–4.0)
MCH: 25.4 pg — ABNORMAL LOW (ref 26.0–34.0)
MCHC: 31.9 g/dL (ref 30.0–36.0)
MCV: 79.8 fL — ABNORMAL LOW (ref 80.0–100.0)
Monocytes Absolute: 0.8 10*3/uL (ref 0.1–1.0)
Monocytes Relative: 6 %
Neutro Abs: 11.1 10*3/uL — ABNORMAL HIGH (ref 1.7–7.7)
Neutrophils Relative %: 82 %
Platelets: 452 10*3/uL — ABNORMAL HIGH (ref 150–400)
RBC: 4.84 MIL/uL (ref 3.87–5.11)
RDW: 14.9 % (ref 11.5–15.5)
WBC: 13.6 10*3/uL — ABNORMAL HIGH (ref 4.0–10.5)
nRBC: 0 % (ref 0.0–0.2)

## 2021-11-05 LAB — COMPREHENSIVE METABOLIC PANEL
ALT: 15 U/L (ref 0–44)
AST: 15 U/L (ref 15–41)
Albumin: 3.9 g/dL (ref 3.5–5.0)
Alkaline Phosphatase: 85 U/L (ref 38–126)
Anion gap: 9 (ref 5–15)
BUN: 11 mg/dL (ref 6–20)
CO2: 26 mmol/L (ref 22–32)
Calcium: 9 mg/dL (ref 8.9–10.3)
Chloride: 102 mmol/L (ref 98–111)
Creatinine, Ser: 0.66 mg/dL (ref 0.44–1.00)
GFR, Estimated: >60 mL/min (ref >60)
Glucose, Bld: 87 mg/dL (ref 70–99)
Potassium: 3.5 mmol/L (ref 3.5–5.1)
Sodium: 137 mmol/L (ref 135–145)
Total Bilirubin: 0.7 mg/dL (ref 0.3–1.2)
Total Protein: 7.3 g/dL (ref 6.5–8.1)

## 2021-11-05 LAB — LIPASE, BLOOD: Lipase: 1502 U/L — ABNORMAL HIGH (ref 11–51)

## 2021-11-05 LAB — TRIGLYCERIDES: Triglycerides: 76 mg/dL (ref <150)

## 2021-11-05 LAB — CBG MONITORING, ED: Glucose-Capillary: 92 mg/dL (ref 70–99)

## 2021-11-05 MED ORDER — METOCLOPRAMIDE HCL 10 MG PO TABS: 10.0000 mg | ORAL_TABLET | Freq: Four times a day (QID) | ORAL | 0 refills | Status: AC

## 2021-11-05 MED ORDER — ONDANSETRON 4 MG PO TBDP: 4.0000 mg | ORAL_TABLET | Freq: Once | ORAL | Status: DC

## 2021-11-05 MED ORDER — ONDANSETRON HCL 4 MG/2ML IJ SOLN
4.0000 mg | Freq: Once | INTRAMUSCULAR | Status: AC
  Administered 2021-11-05: 4 mg via INTRAVENOUS

## 2021-11-05 MED ORDER — METOCLOPRAMIDE HCL 5 MG/ML IJ SOLN
10.0000 mg | Freq: Once | INTRAMUSCULAR | Status: AC
  Administered 2021-11-05: 10 mg via INTRAVENOUS

## 2021-11-05 MED ORDER — SODIUM CHLORIDE 0.9 % IV BOLUS
1000.0000 mL | Freq: Once | INTRAVENOUS | Status: AC
  Administered 2021-11-05: 1000 mL via INTRAVENOUS

## 2021-11-05 MED ORDER — OXYCODONE-ACETAMINOPHEN 5-325 MG PO TABS: 1.0000 | ORAL_TABLET | Freq: Four times a day (QID) | ORAL | 0 refills | Status: DC | PRN

## 2021-11-05 MED ORDER — MORPHINE SULFATE (PF) 4 MG/ML IV SOLN
4.0000 mg | Freq: Once | INTRAVENOUS | Status: AC
  Administered 2021-11-05: 4 mg via INTRAVENOUS

## 2021-11-05 MED ORDER — DIPHENHYDRAMINE HCL 25 MG PO CAPS
25.0000 mg | ORAL_CAPSULE | Freq: Once | ORAL | Status: AC
  Administered 2021-11-05: 25 mg via ORAL

## 2021-11-05 MED ORDER — HYDROMORPHONE HCL 1 MG/ML IJ SOLN
0.5000 mg | Freq: Once | INTRAMUSCULAR | Status: AC
  Administered 2021-11-05: 0.5 mg via INTRAVENOUS

## 2021-11-05 MED ORDER — HYDROCODONE-ACETAMINOPHEN 5-325 MG PO TABS: 2.0000 | ORAL_TABLET | Freq: Once | ORAL | Status: DC

## 2021-11-05 NOTE — ED Provider Notes (Signed)
[facility]-EMERGENCY DEPT Provider Note   CSN: [id number] Arrival date & time: 11/05/21  0543     History Chief Complaint  Patient presents with   Pancreatitis    Ashley Nichols is a 50 y.o. female.  HPI Pt is 50 year old female   HTN, reflux and pancreatitis  Presenting today for 7 days of achy abdominal pain w occasional stabbing pains.  Similar to pancreatitis pain in the past. Nausea no vomiting.   No diarrhea.  She states that she has had persistent symptoms over the past 7 days she states that the pain has been constant but waxing and waning.  She has transitioned herself to a liquid diet and states that this has been helpful.  She denies any blood in stool she denies any cough congestion fevers or chills.  Denies any other associate symptoms.  No aggravating mitigate factors and states that the pain is worse in the left upper and upper middle abdomen.     Past Medical History:  Diagnosis Date   Family history of breast cancer    Family history of thyroid cancer    GERD (gastroesophageal reflux disease)    Hypertension    Kidney stones     Patient Active Problem List   Diagnosis Date Noted   Genetic testing 02/18/2021   Hyperparathyroidism (HCC) 01/28/2021   Family history of thyroid cancer    Family history of breast cancer    Kidney stones    Acute cholecystitis 12/03/2015    Past Surgical History:  Procedure Laterality Date   BREAST SURGERY     CHOLECYSTECTOMY N/A 12/03/2015   Procedure: LAPAROSCOPIC CHOLECYSTECTOMY WITH INTRAOPERATIVE CHOLANGIOGRAM;  Surgeon: Avel Peace, MD;  Location: [facility];  Service: General;  Laterality: N/A;   NASAL SEPTUM SURGERY  2013     OB History   No obstetric history on file.     Family History  Problem Relation Age of Onset   Lung cancer Father 48   Thyroid cancer Maternal Aunt        dx over 71   Lung cancer Maternal Uncle    Breast cancer Paternal Grandmother 31   Lung cancer  Paternal Grandfather    Breast cancer Other        MGMs mother    Social History   Tobacco Use   Smoking status: Never   Smokeless tobacco: Never  Vaping Use   Vaping Use: Never used  Substance Use Topics   Alcohol use: No   Drug use: No    Home Medications Prior to Admission medications   Medication Sig Start Date End Date Taking? Authorizing Provider  metoCLOPramide (REGLAN) 10 MG tablet Take 1 tablet (10 mg total) by mouth every 6 (six) hours. 11/05/21  Yes Terrika Zuver, Stevphen Meuse S, PA  oxyCODONE-acetaminophen (PERCOCET/ROXICET) 5-325 MG tablet Take 1 tablet by mouth every 6 (six) hours as needed for severe pain. 11/05/21  Yes Rosi Secrist S, PA  baclofen (LIORESAL) 10 MG tablet Take by mouth. 12/12/19   [provider]  Cyanocobalamin (VITAMIN B-12 PO) Take 1 tablet by mouth daily.    [provider]  diphenhydrAMINE (SOMINEX) 25 MG tablet Take 25 mg by mouth at bedtime.    [provider]  hydrochlorothiazide (HYDRODIURIL) 12.5 MG tablet TAKE 1 TABLET BY MOUTH EVERY DAY 11/24/19   [provider]  ibuprofen (ADVIL,MOTRIN) 600 MG tablet ONE BY MOUTH EVERY EIGHT HOURS AS NEEDED FOR PAIN 11/07/15   [provider]  ketorolac (TORADOL) 10 MG tablet Take by mouth. 12/28/18   [provider]  loratadine (CLARITIN) 10 MG tablet Take 10 mg by mouth at bedtime.    [provider]  losartan-hydrochlorothiazide (HYZAAR) 50-12.5 MG tablet TAKE 1/4 TABLET BY MOUTH DAILY 10/23/15   [provider]  methocarbamol (ROBAXIN) 750 MG tablet Take 750 mg by mouth 2 (two) times daily. 10/28/19   [provider]  norgestimate-ethinyl estradiol (ORTHO-CYCLEN,SPRINTEC,PREVIFEM) 0.25-35 MG-MCG tablet Take 1 tablet by mouth at bedtime.    [provider]  nortriptyline (PAMELOR) 50 MG capsule Take 100 mg by mouth at bedtime.    [provider]  nortriptyline (PAMELOR) 50 MG capsule Take by mouth. 12/12/19   [provider]  omeprazole (PRILOSEC) 20 MG capsule Take 20 mg by mouth daily.    [provider]  ondansetron (ZOFRAN ODT) 4 MG disintegrating tablet Take 1 tablet (4 mg total) by mouth every 8 (eight) hours as needed for nausea or vomiting. 12/16/19   Little, Ambrose Finland, MD  Red Yeast Rice Extract (RED YEAST RICE PO) Take 2 capsules by mouth daily.    [provider]    Allergies    Ivp dye [iodinated diagnostic agents]  Review of Systems   Review of Systems  Constitutional:  Negative for chills and fever.  HENT:  Negative for congestion.   Eyes:  Negative for pain.  Respiratory:  Negative for cough and shortness of breath.   Cardiovascular:  Negative for chest pain and leg swelling.  Gastrointestinal:  Positive for abdominal pain and nausea. Negative for vomiting.  Genitourinary:  Negative for dysuria.  Musculoskeletal:  Negative for myalgias.  Skin:  Negative for rash.  Neurological:  Negative for dizziness and headaches.   Physical Exam Updated Vital Signs BP (!) 142/86   Pulse 70   Temp 98.8 F (37.1 C) (Oral)   Resp 16   Ht 5\' 7"  (1.702 m)   Wt 104.3 kg   SpO2 98%   BMI 36.01 kg/m   Physical Exam Vitals and nursing note reviewed.  Constitutional:      General: She is not in acute distress. HENT:     Head: Normocephalic and atraumatic.     Nose: Nose normal.  Eyes:     General: No scleral icterus. Cardiovascular:     Rate and Rhythm: Normal rate and regular rhythm.     Pulses: Normal pulses.     Heart sounds: Normal heart sounds.  Pulmonary:     Effort: Pulmonary effort is normal. No respiratory distress.     Breath sounds: No wheezing.  Abdominal:     Palpations: Abdomen is soft.     Tenderness: There is abdominal tenderness.    Musculoskeletal:     Cervical back: Normal range of motion.     Right lower leg: No edema.     Left lower leg: No edema.  Skin:    General: Skin is warm and dry.     Capillary Refill: Capillary refill  takes less than 2 seconds.  Neurological:     Mental Status: She is alert. Mental status is at baseline.  Psychiatric:        Mood and Affect: Mood normal.        Behavior: Behavior normal.    ED Results / Procedures / Treatments   Labs (all labs ordered are listed, but only abnormal results are displayed) Labs Reviewed  LIPASE, BLOOD - Abnormal; Notable for the following components:  Result Value   Lipase 1,502 (*)    All other components within normal limits  CBC WITH DIFFERENTIAL/PLATELET - Abnormal; Notable for the following components:   WBC 13.6 (*)    MCV 79.8 (*)    MCH 25.4 (*)    Platelets 452 (*)    Neutro Abs 11.1 (*)    All other components within normal limits  COMPREHENSIVE METABOLIC PANEL  TRIGLYCERIDES  URINALYSIS, ROUTINE W REFLEX MICROSCOPIC  PREGNANCY, URINE  CBG MONITORING, ED    EKG None  Radiology No results found.  Procedures Procedures   Medications Ordered in ED Medications  ondansetron (ZOFRAN) injection 4 mg (4 mg Intravenous Given 11/05/21 0812)  morphine 4 MG/ML injection 4 mg (4 mg Intravenous Given 11/05/21 0812)  sodium chloride 0.9 % bolus 1,000 mL (0 mLs Intravenous Stopped 11/05/21 1146)  HYDROmorphone (DILAUDID) injection 0.5 mg (0.5 mg Intravenous Given 11/05/21 0940)  metoCLOPramide (REGLAN) injection 10 mg (10 mg Intravenous Given 11/05/21 1145)  diphenhydrAMINE (BENADRYL) capsule 25 mg (25 mg Oral Given 11/05/21 1145)    ED Course  I have reviewed the triage vital signs and the nursing notes.  Pertinent labs & imaging results that were available during my care of the patient were reviewed by me and considered in my medical decision making (see chart for details).  Clinical Course as of 11/05/21 1416  Tue Nov 05, 2021  0749 7 days achy epigastric pain w occasional stabing pains [WF]    Clinical Course User Index [WF] Gailen Shelter, [place]   MDM Rules/Calculators/A&P                          Patient is  50 year old female with past medical history detailed in HPI presented to the ER today with complaints of what feels like her pancreatitis pain.  She has had episodes of this in the past she has been followed by [facility] gastroenterology she states that she has not entirely certain what causes her pancreatitis.  She states she has never been told she has gallstones.  States that she has had her gallbladder removed  On physical exam patient has left upper quadrant and epigastric tenderness.  No guarding or rebound.  No right upper quadrant tenderness.  Negative Murphy sign.  CBC with mild leukocytosis.  She is afebrile here no tachycardia.  CMP unremarkable lipase elevated at 1500  Patient given antiemetics and analgesia.  She states almost complete resolution of pain and is tolerating p.o. states that she only feels nauseous did require 2 doses of nausea medicine however.  Patient reassessed she has only mild epigastric tenderness at this time.  Offered admission to patient she states she would prefer to be discharged home.  We did share decision-making physician I think this is reasonable at this time.  Will discharge home with Percocet Reglan and recommendations for full liquid diet and increased fiber supplementation.  Successfully PO challenged by D. [place] Char[employer] fundraiser. Pt was discharged after successful PO trial.   Final Clinical Impression(s) / ED Diagnoses Final diagnoses:  Acute pancreatitis, unspecified complication status, unspecified pancreatitis type    Rx / DC Orders ED Discharge Orders          Ordered    oxyCODONE-acetaminophen (PERCOCET/ROXICET) 5-325 MG tablet  Every 6 hours PRN        11/05/21 1054    metoCLOPramide (REGLAN) 10 MG tablet  Every 6 hours        11/05/21 1054  Solon Augusta Alcova, [place] 11/05/21 1417    Linwood Dibbles, MD 11/06/21 [phone]

## 2021-11-05 NOTE — Discharge Instructions (Signed)
Please read the attached information on full liquid diet.  You were diagnosed with acute pancreatitis today.  Please follow-up with your gastroenterologist.  Please drink plenty of water.  I prescribed you Percocet to use for pain as needed I recommend using 650 Tylenol every 6 hours and use the Percocet as needed for breakthrough pain

## 2021-11-05 NOTE — ED Provider Notes (Addendum)
Emergency Medicine Provider Triage Evaluation Note  Ashley Nichols , a 50 y.o. female  was evaluated in triage.  Pt complains of epigastric pain.  Onset late last week.  Has hx of the same.  Epigastric pain that radiates to the back.  Denies vomiting, but has had nausea.  Denies any fever.  Doesn't use alcohol.  Has hx of cholecystectomy.  Review of Systems  Positive: Epigastric pain, nausea Negative: Fever, chills  Physical Exam  BP 130/82   Pulse 91   Temp 98.8 F (37.1 C) (Oral)   Resp 16   SpO2 100%  Gen:   Awake, no distress   Resp:  Normal effort  MSK:   Moves extremities without difficulty  Other:  Epigastric pain  Medical Decision Making  Medically screening exam initiated at 6:02 AM.  Appropriate orders placed.  Ashley Nichols was informed that the remainder of the evaluation will be completed by another provider, this initial triage assessment does not replace that evaluation, and the importance of remaining in the ED until their evaluation is complete.  ?pancreatitis   Roxy Horseman, Cordelia Poche 11/05/21 0603   6:15 AM Notified by RN that patient had a syncopal event.  Will check CBG and EKG.  BP is low, I suspect vasovagul.  Will give fluids and continue to monitor.   Roxy Horseman, PA-C 11/05/21 8502    Tilden Fossa, MD 11/05/21 [phone]

## 2021-11-05 NOTE — ED Triage Notes (Signed)
Pt states that she feels that her pancreatitis is flaring up. She has pain that goes across her upper abdomen and under her left breast x 1 week.

## 2021-11-07 ENCOUNTER — Inpatient Hospital Stay (HOSPITAL_COMMUNITY)
Admission: EM | Admit: 2021-11-07 | Discharge: 2021-11-11 | DRG: 440 | Disposition: A | Discharge status: Home / Self Care | Payer: Self-pay | Attending: Internal Medicine | Admitting: Internal Medicine

## 2021-11-07 ENCOUNTER — Encounter (HOSPITAL_COMMUNITY): Payer: Self-pay

## 2021-11-07 ENCOUNTER — Other Ambulatory Visit: Payer: Self-pay

## 2021-11-07 ENCOUNTER — Emergency Department (HOSPITAL_COMMUNITY): Payer: Self-pay

## 2021-11-07 DIAGNOSIS — K859 Acute pancreatitis without necrosis or infection, unspecified: Secondary | ICD-10-CM | POA: Diagnosis present

## 2021-11-07 DIAGNOSIS — E876 Hypokalemia: Secondary | ICD-10-CM | POA: Diagnosis present

## 2021-11-07 DIAGNOSIS — Z20822 Contact with and (suspected) exposure to covid-19: Secondary | ICD-10-CM | POA: Diagnosis present

## 2021-11-07 DIAGNOSIS — E213 Hyperparathyroidism, unspecified: Secondary | ICD-10-CM | POA: Diagnosis present

## 2021-11-07 DIAGNOSIS — K219 Gastro-esophageal reflux disease without esophagitis: Secondary | ICD-10-CM | POA: Diagnosis present

## 2021-11-07 DIAGNOSIS — Z793 Long term (current) use of hormonal contraceptives: Secondary | ICD-10-CM

## 2021-11-07 DIAGNOSIS — Z87442 Personal history of urinary calculi: Secondary | ICD-10-CM

## 2021-11-07 DIAGNOSIS — K861 Other chronic pancreatitis: Secondary | ICD-10-CM | POA: Diagnosis present

## 2021-11-07 DIAGNOSIS — I10 Essential (primary) hypertension: Secondary | ICD-10-CM | POA: Diagnosis present

## 2021-11-07 DIAGNOSIS — K8500 Idiopathic acute pancreatitis without necrosis or infection: Principal | ICD-10-CM | POA: Diagnosis present

## 2021-11-07 DIAGNOSIS — D75839 Thrombocytosis, unspecified: Secondary | ICD-10-CM | POA: Diagnosis present

## 2021-11-07 DIAGNOSIS — Z91041 Radiographic dye allergy status: Secondary | ICD-10-CM

## 2021-11-07 DIAGNOSIS — Z79899 Other long term (current) drug therapy: Secondary | ICD-10-CM

## 2021-11-07 LAB — CBC WITH DIFFERENTIAL/PLATELET
Abs Immature Granulocytes: 0.53 10*3/uL — ABNORMAL HIGH (ref 0.00–0.07)
Basophils Absolute: 0.1 10*3/uL (ref 0.0–0.1)
Basophils Relative: 0 %
Eosinophils Absolute: 0.1 10*3/uL (ref 0.0–0.5)
Eosinophils Relative: 0 %
HCT: 38.2 % (ref 36.0–46.0)
Hemoglobin: 11.9 g/dL — ABNORMAL LOW (ref 12.0–15.0)
Immature Granulocytes: 2 %
Lymphocytes Relative: 2 %
Lymphs Abs: 0.7 10*3/uL (ref 0.7–4.0)
MCH: 25 pg — ABNORMAL LOW (ref 26.0–34.0)
MCHC: 31.2 g/dL (ref 30.0–36.0)
MCV: 80.3 fL (ref 80.0–100.0)
Monocytes Absolute: 2 10*3/uL — ABNORMAL HIGH (ref 0.1–1.0)
Monocytes Relative: 6 %
Neutro Abs: 31.3 10*3/uL — ABNORMAL HIGH (ref 1.7–7.7)
Neutrophils Relative %: 90 %
Platelets: 543 10*3/uL — ABNORMAL HIGH (ref 150–400)
RBC: 4.76 MIL/uL (ref 3.87–5.11)
RDW: 14.6 % (ref 11.5–15.5)
WBC: 34.7 10*3/uL — ABNORMAL HIGH (ref 4.0–10.5)
nRBC: 0 % (ref 0.0–0.2)

## 2021-11-07 LAB — COMPREHENSIVE METABOLIC PANEL
ALT: 15 U/L (ref 0–44)
AST: 15 U/L (ref 15–41)
Albumin: 3.8 g/dL (ref 3.5–5.0)
Alkaline Phosphatase: 125 U/L (ref 38–126)
Anion gap: 9 (ref 5–15)
BUN: 14 mg/dL (ref 6–20)
CO2: 27 mmol/L (ref 22–32)
Calcium: 9.1 mg/dL (ref 8.9–10.3)
Chloride: 97 mmol/L — ABNORMAL LOW (ref 98–111)
Creatinine, Ser: 0.95 mg/dL (ref 0.44–1.00)
GFR, Estimated: >60 mL/min (ref >60)
Glucose, Bld: 121 mg/dL — ABNORMAL HIGH (ref 70–99)
Potassium: 3.3 mmol/L — ABNORMAL LOW (ref 3.5–5.1)
Sodium: 133 mmol/L — ABNORMAL LOW (ref 135–145)
Total Bilirubin: 0.9 mg/dL (ref 0.3–1.2)
Total Protein: 7.7 g/dL (ref 6.5–8.1)

## 2021-11-07 LAB — RESP PANEL BY RT-PCR (FLU A&B, COVID) ARPGX2
Influenza A by PCR: NEGATIVE
Influenza B by PCR: NEGATIVE
SARS Coronavirus 2 by RT PCR: NEGATIVE

## 2021-11-07 LAB — LIPASE, BLOOD: Lipase: 53 U/L — ABNORMAL HIGH (ref 11–51)

## 2021-11-07 MED ORDER — ONDANSETRON 8 MG PO TBDP
8.0000 mg | ORAL_TABLET | Freq: Once | ORAL | Status: AC
  Administered 2021-11-07: 8 mg via ORAL
  Filled 2021-11-07: qty 1

## 2021-11-07 MED ORDER — OXYCODONE-ACETAMINOPHEN 5-325 MG PO TABS
1.0000 | ORAL_TABLET | Freq: Once | ORAL | Status: AC
  Administered 2021-11-07: 1 via ORAL
  Filled 2021-11-07: qty 1

## 2021-11-07 NOTE — ED Triage Notes (Signed)
Pt states she is here for her pancreatitis. Pt states nothing is helping with the pain. Pt c/o nausea with no emesis.

## 2021-11-07 NOTE — ED Provider Notes (Signed)
Emergency Medicine Provider Triage Evaluation Note  Ashley Nichols , a 50 y.o. female  was evaluated in triage.  Pt complains of pain from pancreatitis.  She has had similar episodes in the past, was diagnosed 2 days ago and given pain medicine.  She tried tolerating at home, the pain is too severe and she was seen by [facility] earlier today who advised to go to the ED for admission for pain control..  Review of Systems  Positive: Abdominal pain, nausea Negative: Vomiting, fever  Physical Exam  BP 118/76   Pulse 77   Temp 98 F (36.7 C) (Oral)   Resp 18   SpO2 99%  Gen:   Awake, no distress   Resp:  Normal effort  MSK:   Moves extremities without difficulty  Other:  Epigastric tenderness  Medical Decision Making  Medically screening exam initiated at 5:22 PM.  Appropriate orders placed.  Ashley Nichols was informed that the remainder of the evaluation will be completed by another provider, this initial triage assessment does not replace that evaluation, and the importance of remaining in the ED until their evaluation is complete.  Stable vital signs, patient does appear uncomfortable.  Will repeat labs and get a COVID test and plan for likely admission.    Theron Arista, PA-C 11/07/21 1723    Jacalyn Lefevre, MD 11/08/21 [phone]

## 2021-11-08 DIAGNOSIS — K859 Acute pancreatitis without necrosis or infection, unspecified: Secondary | ICD-10-CM

## 2021-11-08 DIAGNOSIS — E876 Hypokalemia: Secondary | ICD-10-CM | POA: Insufficient documentation

## 2021-11-08 DIAGNOSIS — K219 Gastro-esophageal reflux disease without esophagitis: Secondary | ICD-10-CM | POA: Insufficient documentation

## 2021-11-08 DIAGNOSIS — I10 Essential (primary) hypertension: Secondary | ICD-10-CM

## 2021-11-08 LAB — URINALYSIS, ROUTINE W REFLEX MICROSCOPIC
Bilirubin Urine: NEGATIVE
Glucose, UA: NEGATIVE mg/dL
Ketones, ur: 5 mg/dL — AB
Nitrite: NEGATIVE
Protein, ur: NEGATIVE mg/dL
Specific Gravity, Urine: 1.013 (ref 1.005–1.030)
pH: 5 (ref 5.0–8.0)

## 2021-11-08 LAB — CBC
HCT: 29 % — ABNORMAL LOW (ref 36.0–46.0)
Hemoglobin: 9.1 g/dL — ABNORMAL LOW (ref 12.0–15.0)
MCH: 25.1 pg — ABNORMAL LOW (ref 26.0–34.0)
MCHC: 31.4 g/dL (ref 30.0–36.0)
MCV: 79.9 fL — ABNORMAL LOW (ref 80.0–100.0)
Platelets: 375 10*3/uL (ref 150–400)
RBC: 3.63 MIL/uL — ABNORMAL LOW (ref 3.87–5.11)
RDW: 14.6 % (ref 11.5–15.5)
WBC: 23.3 10*3/uL — ABNORMAL HIGH (ref 4.0–10.5)
nRBC: 0 % (ref 0.0–0.2)

## 2021-11-08 LAB — HIV ANTIBODY (ROUTINE TESTING W REFLEX): HIV Screen 4th Generation wRfx: NONREACTIVE

## 2021-11-08 LAB — COMPREHENSIVE METABOLIC PANEL
ALT: 13 U/L (ref 0–44)
AST: 12 U/L — ABNORMAL LOW (ref 15–41)
Albumin: 3 g/dL — ABNORMAL LOW (ref 3.5–5.0)
Alkaline Phosphatase: 101 U/L (ref 38–126)
Anion gap: 3 — ABNORMAL LOW (ref 5–15)
BUN: 13 mg/dL (ref 6–20)
CO2: 27 mmol/L (ref 22–32)
Calcium: 8.1 mg/dL — ABNORMAL LOW (ref 8.9–10.3)
Chloride: 104 mmol/L (ref 98–111)
Creatinine, Ser: 0.73 mg/dL (ref 0.44–1.00)
GFR, Estimated: >60 mL/min (ref >60)
Glucose, Bld: 96 mg/dL (ref 70–99)
Potassium: 3.1 mmol/L — ABNORMAL LOW (ref 3.5–5.1)
Sodium: 134 mmol/L — ABNORMAL LOW (ref 135–145)
Total Bilirubin: 0.7 mg/dL (ref 0.3–1.2)
Total Protein: 6.1 g/dL — ABNORMAL LOW (ref 6.5–8.1)

## 2021-11-08 LAB — MAGNESIUM: Magnesium: 2.1 mg/dL (ref 1.7–2.4)

## 2021-11-08 LAB — LACTIC ACID, PLASMA: Lactic Acid, Venous: 1.1 mmol/L (ref 0.5–1.9)

## 2021-11-08 LAB — TRIGLYCERIDES: Triglycerides: 48 mg/dL (ref <150)

## 2021-11-08 MED ORDER — ENOXAPARIN SODIUM 40 MG/0.4ML IJ SOSY
40.0000 mg | PREFILLED_SYRINGE | INTRAMUSCULAR | Status: DC
  Administered 2021-11-08 – 2021-11-10 (×3): 40 mg via SUBCUTANEOUS
  Filled 2021-11-08 (×3): qty 0.4

## 2021-11-08 MED ORDER — FAMOTIDINE IN NACL 20-0.9 MG/50ML-% IV SOLN
20.0000 mg | INTRAVENOUS | Status: DC
  Administered 2021-11-08: 20 mg via INTRAVENOUS
  Filled 2021-11-08: qty 50

## 2021-11-08 MED ORDER — SODIUM CHLORIDE 0.9 % IV BOLUS
1000.0000 mL | Freq: Once | INTRAVENOUS | Status: AC
  Administered 2021-11-08: 1000 mL via INTRAVENOUS

## 2021-11-08 MED ORDER — HYDROMORPHONE HCL 1 MG/ML IJ SOLN
0.5000 mg | INTRAMUSCULAR | Status: AC | PRN
  Administered 2021-11-08 (×3): 0.5 mg via INTRAVENOUS
  Administered 2021-11-08: 1 mg via INTRAVENOUS
  Filled 2021-11-08 (×4): qty 1

## 2021-11-08 MED ORDER — PROCHLORPERAZINE EDISYLATE 10 MG/2ML IJ SOLN
5.0000 mg | INTRAMUSCULAR | Status: DC | PRN
  Administered 2021-11-08 – 2021-11-11 (×5): 5 mg via INTRAVENOUS
  Filled 2021-11-08 (×5): qty 2

## 2021-11-08 MED ORDER — PANTOPRAZOLE SODIUM 40 MG PO TBEC
40.0000 mg | DELAYED_RELEASE_TABLET | Freq: Two times a day (BID) | ORAL | Status: DC
  Administered 2021-11-08 – 2021-11-10 (×5): 40 mg via ORAL
  Filled 2021-11-08 (×5): qty 1

## 2021-11-08 MED ORDER — BUTALBITAL-APAP-CAFFEINE 50-325-40 MG PO TABS
1.0000 | ORAL_TABLET | Freq: Once | ORAL | Status: AC | PRN
  Administered 2021-11-08: 1 via ORAL
  Filled 2021-11-08: qty 1

## 2021-11-08 MED ORDER — BISACODYL 10 MG RE SUPP
10.0000 mg | Freq: Every day | RECTAL | Status: DC | PRN
  Administered 2021-11-08: 10 mg via RECTAL
  Filled 2021-11-08: qty 1

## 2021-11-08 MED ORDER — ONDANSETRON HCL 4 MG/2ML IJ SOLN
4.0000 mg | Freq: Four times a day (QID) | INTRAMUSCULAR | Status: DC | PRN
  Administered 2021-11-09 – 2021-11-11 (×3): 4 mg via INTRAVENOUS
  Filled 2021-11-08 (×3): qty 2

## 2021-11-08 MED ORDER — ACETAMINOPHEN 650 MG RE SUPP: 650.0000 mg | Freq: Four times a day (QID) | RECTAL | Status: DC | PRN

## 2021-11-08 MED ORDER — ACETAMINOPHEN 325 MG PO TABS: 650.0000 mg | ORAL_TABLET | Freq: Four times a day (QID) | ORAL | Status: DC | PRN

## 2021-11-08 MED ORDER — FENTANYL CITRATE PF 50 MCG/ML IJ SOSY
25.0000 ug | PREFILLED_SYRINGE | INTRAMUSCULAR | Status: DC | PRN
Start: 2021-11-08 — End: 2021-11-08

## 2021-11-08 MED ORDER — HYDROMORPHONE HCL 1 MG/ML IJ SOLN
0.5000 mg | INTRAMUSCULAR | Status: AC | PRN
Start: 2021-11-08 — End: 2021-11-10
  Administered 2021-11-08 – 2021-11-09 (×5): 1 mg via INTRAVENOUS
  Filled 2021-11-08 (×5): qty 1

## 2021-11-08 MED ORDER — MORPHINE SULFATE (PF) 4 MG/ML IV SOLN
4.0000 mg | Freq: Once | INTRAVENOUS | Status: AC
  Administered 2021-11-08: 4 mg via INTRAVENOUS
  Filled 2021-11-08: qty 1

## 2021-11-08 MED ORDER — ONDANSETRON HCL 4 MG/2ML IJ SOLN
4.0000 mg | Freq: Once | INTRAMUSCULAR | Status: AC
  Administered 2021-11-08: 4 mg via INTRAVENOUS
  Filled 2021-11-08: qty 2

## 2021-11-08 MED ORDER — SODIUM CHLORIDE 0.9 % IV SOLN: INTRAVENOUS | Status: AC

## 2021-11-08 MED ORDER — POTASSIUM CHLORIDE 10 MEQ/100ML IV SOLN
10.0000 meq | INTRAVENOUS | Status: AC
  Administered 2021-11-08 (×3): 10 meq via INTRAVENOUS
  Filled 2021-11-08 (×3): qty 100

## 2021-11-08 MED ORDER — OXYCODONE-ACETAMINOPHEN 5-325 MG PO TABS
1.0000 | ORAL_TABLET | Freq: Four times a day (QID) | ORAL | Status: DC | PRN
  Administered 2021-11-08: 1 via ORAL
  Filled 2021-11-08: qty 1

## 2021-11-08 NOTE — ED Provider Notes (Signed)
[facility] Provider Note   CSN: [id number] Arrival date & time: 11/07/21  1659     History Chief Complaint  Patient presents with   Pancreatitis    Ashley Nichols is a 50 y.o. female presenting for evaluation abdominal pain and nausea.  Patient states she has had gradually worsening abdominal pain for about 10 days.  She was seen in the ER 2 days ago, diagnosed with acute pancreatitis.  Pain was controlled during her ER visit, and she was discharged home with oral antiemetics and pain medicine.  However over the past 48 hours, patient reports worsening symptoms.  She saw her GI doctor today, who evaluated her and recommended she come to the ER for admission for persistent pain in the setting of pancreatitis.  Patient reports continued nausea.  She is having difficulty eating and drinking due to this causing increased pain and nausea.  She denies fever, cough, shortness of breath, vomiting, urinary symptoms, normal bowel movements.  She has had pancreatitis before, unknown etiology.  She does not drink alcohol.  Has a previous cholecystectomy. She has been taking her oxycodone without improvement of pain. Nothing makes it better.  HPI     Past Medical History:  Diagnosis Date   Family history of breast cancer    Family history of thyroid cancer    GERD (gastroesophageal reflux disease)    Hypertension    Kidney stones     Patient Active Problem List   Diagnosis Date Noted   Genetic testing 02/18/2021   Hyperparathyroidism (HCC) 01/28/2021   Family history of thyroid cancer    Family history of breast cancer    Kidney stones    Acute cholecystitis 12/03/2015    Past Surgical History:  Procedure Laterality Date   BREAST SURGERY     CHOLECYSTECTOMY N/A 12/03/2015   Procedure: LAPAROSCOPIC CHOLECYSTECTOMY WITH INTRAOPERATIVE CHOLANGIOGRAM;  Surgeon: Avel Peace, MD;  Location: [facility];  Service: General;  Laterality: N/A;   NASAL SEPTUM  SURGERY  2013     OB History   No obstetric history on file.     Family History  Problem Relation Age of Onset   Lung cancer Father 29   Thyroid cancer Maternal Aunt        dx over 60   Lung cancer Maternal Uncle    Breast cancer Paternal Grandmother 109   Lung cancer Paternal Grandfather    Breast cancer Other        MGMs mother    Social History   Tobacco Use   Smoking status: Never   Smokeless tobacco: Never  Vaping Use   Vaping Use: Never used  Substance Use Topics   Alcohol use: No   Drug use: No    Home Medications Prior to Admission medications   Medication Sig Start Date End Date Taking? Authorizing Provider  baclofen (LIORESAL) 10 MG tablet Take by mouth. 12/12/19   [provider]  Cyanocobalamin (VITAMIN B-12 PO) Take 1 tablet by mouth daily.    [provider]  diphenhydrAMINE (SOMINEX) 25 MG tablet Take 25 mg by mouth at bedtime.    [provider]  hydrochlorothiazide (HYDRODIURIL) 12.5 MG tablet TAKE 1 TABLET BY MOUTH EVERY DAY 11/24/19   [provider]  ibuprofen (ADVIL,MOTRIN) 600 MG tablet ONE BY MOUTH EVERY EIGHT HOURS AS NEEDED FOR PAIN 11/07/15   [provider]  ketorolac (TORADOL) 10 MG tablet Take by mouth. 12/28/18   [provider]  loratadine (  CLARITIN) 10 MG tablet Take 10 mg by mouth at bedtime.    [provider]  losartan-hydrochlorothiazide (HYZAAR) 50-12.5 MG tablet TAKE 1/4 TABLET BY MOUTH DAILY 10/23/15   [provider]  methocarbamol (ROBAXIN) 750 MG tablet Take 750 mg by mouth 2 (two) times daily. 10/28/19   [provider]  metoCLOPramide (REGLAN) 10 MG tablet Take 1 tablet (10 mg total) by mouth every 6 (six) hours. 11/05/21   Gailen Shelter, PA  norgestimate-ethinyl estradiol (ORTHO-CYCLEN,SPRINTEC,PREVIFEM) 0.25-35 MG-MCG tablet Take 1 tablet by mouth at bedtime.    [provider]  nortriptyline (PAMELOR) 50 MG capsule Take 100 mg by mouth  at bedtime.    [provider]  nortriptyline (PAMELOR) 50 MG capsule Take by mouth. 12/12/19   [provider]  omeprazole (PRILOSEC) 20 MG capsule Take 20 mg by mouth daily.    [provider]  ondansetron (ZOFRAN ODT) 4 MG disintegrating tablet Take 1 tablet (4 mg total) by mouth every 8 (eight) hours as needed for nausea or vomiting. 12/16/19   Little, Ambrose Finland, MD  oxyCODONE-acetaminophen (PERCOCET/ROXICET) 5-325 MG tablet Take 1 tablet by mouth every 6 (six) hours as needed for severe pain. 11/05/21   Gailen Shelter, PA  Red Yeast Rice Extract (RED YEAST RICE PO) Take 2 capsules by mouth daily.    [provider]    Allergies    Ivp dye [iodinated diagnostic agents]  Review of Systems   Review of Systems  Gastrointestinal:  Positive for abdominal pain and nausea.  All other systems reviewed and are negative.  Physical Exam Updated Vital Signs BP 110/70 (BP Location: Right Arm)   Pulse (!) 105   Temp 98 F (36.7 C) (Oral)   Resp 17   SpO2 99%   Physical Exam Vitals and nursing note reviewed.  Constitutional:      General: She is not in acute distress.    Appearance: Normal appearance. She is obese.     Comments: nontoxic  HENT:     Head: Normocephalic and atraumatic.  Eyes:     Conjunctiva/sclera: Conjunctivae normal.     Pupils: Pupils are equal, round, and reactive to light.  Cardiovascular:     Rate and Rhythm: Normal rate and regular rhythm.     Pulses: Normal pulses.  Pulmonary:     Effort: Pulmonary effort is normal. No respiratory distress.     Breath sounds: Normal breath sounds. No wheezing.     Comments: Speaking in full sentences.  Clear lung sounds in all fields. Abdominal:     General: There is no distension.     Palpations: Abdomen is soft. There is no mass.     Tenderness: There is abdominal tenderness. There is no guarding or rebound.     Comments: TTP of epigastric and RUQ abd. No rigidity or distention   Musculoskeletal:        General: Normal range of motion.     Cervical back: Normal range of motion and neck supple.  Skin:    General: Skin is warm and dry.     Capillary Refill: Capillary refill takes less than 2 seconds.  Neurological:     Mental Status: She is alert and oriented to person, place, and time.  Psychiatric:        Mood and Affect: Mood and affect normal.        Speech: Speech normal.        Behavior: Behavior normal.    ED  Results / Procedures / Treatments   Labs (all labs ordered are listed, but only abnormal results are displayed) Labs Reviewed  CBC WITH DIFFERENTIAL/PLATELET - Abnormal; Notable for the following components:      Result Value   WBC 34.7 (*)    Hemoglobin 11.9 (*)    MCH 25.0 (*)    Platelets 543 (*)    Neutro Abs 31.3 (*)    Monocytes Absolute 2.0 (*)    Abs Immature Granulocytes 0.53 (*)    All other components within normal limits  COMPREHENSIVE METABOLIC PANEL - Abnormal; Notable for the following components:   Sodium 133 (*)    Potassium 3.3 (*)    Chloride 97 (*)    Glucose, Bld 121 (*)    All other components within normal limits  LIPASE, BLOOD - Abnormal; Notable for the following components:   Lipase 53 (*)    All other components within normal limits  RESP PANEL BY RT-PCR (FLU A&B, COVID) ARPGX2  URINALYSIS, ROUTINE W REFLEX MICROSCOPIC    EKG None  Radiology CT ABDOMEN PELVIS WO CONTRAST  Result Date: 11/07/2021 CLINICAL DATA:  Nausea and vomiting with epigastric pain. History of pancreatitis. EXAM: CT ABDOMEN AND PELVIS WITHOUT CONTRAST TECHNIQUE: Multidetector CT imaging of the abdomen and pelvis was performed following the standard protocol without IV contrast. COMPARISON:  CT abdomen and pelvis 12/16/2019. FINDINGS: Lower chest: There are atelectatic changes in both lung bases. Hepatobiliary: No focal liver abnormality is seen. Status post cholecystectomy. No biliary dilatation. Pancreas: There is mild diffuse  peripancreatic inflammatory stranding and fluid. No fluid collections are identified. No pancreatic ductal dilatation. Spleen: Normal in size without focal abnormality. Adrenals/Urinary Tract: Adrenal glands are unremarkable. Kidneys are normal, without renal calculi, focal lesion, or hydronephrosis. Bladder is unremarkable. Stomach/Bowel: Stomach is within normal limits. Appendix appears normal. No evidence of bowel wall thickening, distention, or inflammatory changes. There is sigmoid and descending colon diverticulosis without evidence for acute diverticulitis. Vascular/Lymphatic: No significant vascular findings are present. No enlarged abdominal or pelvic lymph nodes. Reproductive: Uterus and bilateral adnexa are unremarkable. Other: No abdominal wall hernia or abnormality. No abdominopelvic ascites. Musculoskeletal: No acute or significant osseous findings. IMPRESSION: Acute pancreatitis.  No evidence for fluid collection. Electronically Signed   By: Darliss Cheney M.D.   On: 11/07/2021 22:37    Procedures Procedures   Medications Ordered in ED Medications  morphine 4 MG/ML injection 4 mg (has no administration in time range)  ondansetron (ZOFRAN) injection 4 mg (has no administration in time range)  sodium chloride 0.9 % bolus 1,000 mL (has no administration in time range)  ondansetron (ZOFRAN-ODT) disintegrating tablet 8 mg (8 mg Oral Given 11/07/21 1740)  oxyCODONE-acetaminophen (PERCOCET/ROXICET) 5-325 MG per tablet 1 tablet (1 tablet Oral Given 11/07/21 1740)    ED Course  I have reviewed the triage vital signs and the nursing notes.  Pertinent labs & imaging results that were available during my care of the patient were reviewed by me and considered in my medical decision making (see chart for details).    MDM Rules/Calculators/A&P                           Patient presenting for evaluation of upper abdominal pain, nausea, uncontrolled with home pain and nausea medications.  On exam,  patient appears nontoxic.  She does have tenderness palpation of the abdomen.  Labs obtained from triage interpreted by me, shows significantly elevated leukocytosis of  35.  Lipase however is much improved at 53.  Remaining labs are reassuring.  Due to the location of her pain, recent diagnosis of pancreatitis, and worsening white count, will obtain CT.  CT consistent with acute pancreatitis.  In the setting of continued severe pain and significant leukocytosis, patient to be admitted for continued monitoring and pain control.  Discussed with Dr. Antionette Char from [facility], patient to be admitted.  Final Clinical Impression(s) / ED Diagnoses Final diagnoses:  Acute pancreatitis, unspecified complication status, unspecified pancreatitis type    Rx / DC Orders ED Discharge Orders     None        Alveria Apley, PA-C 11/08/21 0043    Molpus, Jonny Ruiz, MD 11/08/21 [phone]

## 2021-11-08 NOTE — H&P (Signed)
History and Physical    Ashley Nichols:[id number] DOB: Apr 14, 1971 DOA: 11/07/2021  PCP: System, Provider Not In   Patient coming from: Home   Chief Complaint: Abdominal pain, nausea   HPI: Ashley Nichols is a pleasant 50 y.o. female with medical history significant for hypertension, GERD, hyperparathyroidism, and pancreatitis, now presenting to the emergency department for evaluation of epigastric pain and nausea.  Patient was seen in the emergency department on 11/05/2021 with several days of epigastric pain and nausea, and elevated lipase, was able to tolerate fluids and medications by mouth, and went home with Percocet and Reglan.  She has had worsening pain and nausea at home despite these medications.  She denies any alcohol use, has history of cholecystectomy, denies any family history of pancreatitis and reports that she had negative genetic testing with GI last year.  She denies fevers, chills, or chest pain.  ED Course: Upon arrival to the ED, patient is found to be afebrile, saturating well on room air, and tachycardic to 110s.  Chemistry panel notable for sodium 133 and potassium 3.3.  CBC with leukocytosis 34,700 and thrombocytosis to 543,000.  CT findings consistent with acute pancreatitis without fluid collection.  Patient was given IV fluids, analgesics, and antiemetics in the ED.  Review of Systems:  All other systems reviewed and apart from HPI, are negative.  Past Medical History:  Diagnosis Date   Family history of breast cancer    Family history of thyroid cancer    GERD (gastroesophageal reflux disease)    Hypertension    Kidney stones     Past Surgical History:  Procedure Laterality Date   BREAST SURGERY     CHOLECYSTECTOMY N/A 12/03/2015   Procedure: LAPAROSCOPIC CHOLECYSTECTOMY WITH INTRAOPERATIVE CHOLANGIOGRAM;  Surgeon: Avel Peace, MD;  Location: [facility];  Service: General;  Laterality: N/A;   NASAL SEPTUM SURGERY  2013    Social History:   reports  that she has never smoked. She has never used smokeless tobacco. She reports that she does not drink alcohol and does not use drugs.  Allergies  Allergen Reactions   Ivp Dye [Iodinated Diagnostic Agents] Hives    Family History  Problem Relation Age of Onset   Lung cancer Father 37   Thyroid cancer Maternal Aunt        dx over 70   Lung cancer Maternal Uncle    Breast cancer Paternal Grandmother 71   Lung cancer Paternal Grandfather    Breast cancer Other        MGMs mother     Prior to Admission medications   Medication Sig Start Date End Date Taking? Authorizing Provider  baclofen (LIORESAL) 10 MG tablet Take by mouth. 12/12/19   [provider]  Cyanocobalamin (VITAMIN B-12 PO) Take 1 tablet by mouth daily.    [provider]  diphenhydrAMINE (SOMINEX) 25 MG tablet Take 25 mg by mouth at bedtime.    [provider]  hydrochlorothiazide (HYDRODIURIL) 12.5 MG tablet TAKE 1 TABLET BY MOUTH EVERY DAY 11/24/19   [provider]  ibuprofen (ADVIL,MOTRIN) 600 MG tablet ONE BY MOUTH EVERY EIGHT HOURS AS NEEDED FOR PAIN 11/07/15   [provider]  ketorolac (TORADOL) 10 MG tablet Take by mouth. 12/28/18   [provider]  loratadine (CLARITIN) 10 MG tablet Take 10 mg by mouth at bedtime.    [provider]  losartan-hydrochlorothiazide (HYZAAR) 50-12.5 MG tablet TAKE 1/4 TABLET BY MOUTH DAILY 10/23/15   [provider]  methocarbamol (ROBAXIN) 750 MG tablet Take 750 mg by mouth 2 (two) times daily. 10/28/19   [provider]  metoCLOPramide (REGLAN) 10 MG tablet Take 1 tablet (10 mg total) by mouth every 6 (six) hours. 11/05/21   Gailen Shelter, PA  norgestimate-ethinyl estradiol (ORTHO-CYCLEN,SPRINTEC,PREVIFEM) 0.25-35 MG-MCG tablet Take 1 tablet by mouth at bedtime.    [provider]  nortriptyline (PAMELOR) 50 MG capsule Take 100 mg by mouth at bedtime.    [provider]  nortriptyline  (PAMELOR) 50 MG capsule Take by mouth. 12/12/19   [provider]  omeprazole (PRILOSEC) 20 MG capsule Take 20 mg by mouth daily.    [provider]  ondansetron (ZOFRAN ODT) 4 MG disintegrating tablet Take 1 tablet (4 mg total) by mouth every 8 (eight) hours as needed for nausea or vomiting. 12/16/19   Little, Ambrose [place], MD  oxyCODONE-acetaminophen (PERCOCET/ROXICET) 5-325 MG tablet Take 1 tablet by mouth every 6 (six) hours as needed for severe pain. 11/05/21   Gailen Shelter, PA  Red Yeast Rice Extract (RED YEAST RICE PO) Take 2 capsules by mouth daily.    [provider]    Physical Exam: Vitals:   11/07/21 1708 11/07/21 2018 11/07/21 2312  BP: 118/76 117/71 110/70  Pulse: 77 (!) 113 (!) 105  Resp: 18 18 17   Temp: 98 F (36.7 C)    TempSrc: Oral    SpO2: 99% 99% 99%    Constitutional: NAD, calm  Eyes: PERTLA, lids and conjunctivae normal ENMT: Mucous membranes are moist. Posterior pharynx clear of any exudate or lesions.   Neck: supple, no masses  Respiratory: no wheezing, no crackles. No accessory muscle use.  Cardiovascular: Rate ~100 and regular. No significant JVD. Abdomen: tender in epigastrium, voluntary guarding. Bowel sounds active.  Musculoskeletal: no clubbing / cyanosis. No joint deformity upper and lower extremities.   Skin: no significant rashes, lesions, ulcers. Warm, dry, well-perfused. Neurologic: CN 2-12 grossly intact. Moving all extremities. Alert and oriented.  Psychiatric: Pleasant. Cooperative.    Labs and Imaging on Admission: I have personally reviewed following labs and imaging studies  CBC: Recent Labs  Lab 11/05/21 0559 11/07/21 1737  WBC 13.6* 34.7*  NEUTROABS 11.1* 31.3*  HGB 12.3 11.9*  HCT 38.6 38.2  MCV 79.8* 80.3  PLT 452* 543*   Basic Metabolic Panel: Recent Labs  Lab 11/05/21 0559 11/07/21 1737  NA 137 133*  K 3.5 3.3*  CL 102 97*  CO2 26 27  GLUCOSE 87 121*  BUN 11 14  CREATININE 0.66 0.95   CALCIUM 9.0 9.1   GFR: Estimated Creatinine Clearance: 89 mL/min (by C-G formula based on SCr of 0.95 mg/dL). Liver Function Tests: Recent Labs  Lab 11/05/21 0559 11/07/21 1737  AST 15 15  ALT 15 15  ALKPHOS 85 125  BILITOT 0.7 0.9  PROT 7.3 7.7  ALBUMIN 3.9 3.8   Recent Labs  Lab 11/05/21 0559 11/07/21 1737  LIPASE 1,502* 53*   No results for input(s): AMMONIA in the last 168 hours. Coagulation Profile: No results for input(s): INR, PROTIME in the last 168 hours. Cardiac Enzymes: No results for input(s): CKTOTAL, CKMB, CKMBINDEX, TROPONINI in the last 168 hours. BNP (last 3 results) No results for input(s): PROBNP in the last 8760 hours. HbA1C: No results for input(s): HGBA1C in the last 72 hours. CBG: Recent Labs  Lab 11/05/21 0608  GLUCAP 92   Lipid Profile: Recent Labs    11/05/21 0559 11/08/21 0055  TRIG  76 48   Thyroid Function Tests: No results for input(s): TSH, T4TOTAL, FREET4, T3FREE, THYROIDAB in the last 72 hours. Anemia Panel: No results for input(s): VITAMINB12, FOLATE, FERRITIN, TIBC, IRON, RETICCTPCT in the last 72 hours. Urine analysis:    Component Value Date/Time   COLORURINE YELLOW 11/08/2021 0136   APPEARANCEUR CLOUDY (A) 11/08/2021 0136   LABSPEC 1.013 11/08/2021 0136   PHURINE 5.0 11/08/2021 0136   GLUCOSEU NEGATIVE 11/08/2021 0136   HGBUR SMALL (A) 11/08/2021 0136   BILIRUBINUR NEGATIVE 11/08/2021 0136   KETONESUR 5 (A) 11/08/2021 0136   PROTEINUR NEGATIVE 11/08/2021 0136   UROBILINOGEN 0.2 11/11/2010 1516   NITRITE NEGATIVE 11/08/2021 0136   LEUKOCYTESUR MODERATE (A) 11/08/2021 0136   Sepsis Labs: (procalcitonin:4,lacticidven:4) ) Recent Results (from the past 240 hour(s))  Resp Panel by RT-PCR (Flu A&B, Covid) Nasopharyngeal Swab     Status: None   Collection Time: 11/07/21  5:37 PM   Specimen: Nasopharyngeal Swab; Nasopharyngeal(NP) swabs in vial transport medium  Result Value Ref Range Status   SARS  Coronavirus 2 by RT PCR NEGATIVE NEGATIVE Final    Comment: (NOTE) SARS-CoV-2 target nucleic acids are NOT DETECTED.  The SARS-CoV-2 RNA is generally detectable in upper respiratory specimens during the acute phase of infection. The lowest concentration of SARS-CoV-2 viral copies this assay can detect is 138 copies/mL. A negative result does not preclude SARS-Cov-2 infection and should not be used as the sole basis for treatment or other patient management decisions. A negative result may occur with  improper specimen collection/handling, submission of specimen other than nasopharyngeal swab, presence of viral mutation(s) within the areas targeted by this assay, and inadequate number of viral copies(<138 copies/mL). A negative result must be combined with clinical observations, patient history, and epidemiological information. The expected result is Negative.  Fact Sheet for Patients:  BloggerCourse.com  Fact Sheet for Healthcare Providers:  SeriousBroker.it  This test is no t yet approved or cleared by the [place] FDA and  has been authorized for detection and/or diagnosis of SARS-CoV-2 by FDA under an Emergency Use Authorization (EUA). This EUA will remain  in effect (meaning this test can be used) for the duration of the COVID-19 declaration under Section 564(b)(1) of the Act, 21 U.S.C.section 360bbb-3(b)(1), unless the authorization is terminated  or revoked sooner.       Influenza A by PCR NEGATIVE NEGATIVE Final   Influenza B by PCR NEGATIVE NEGATIVE Final    Comment: (NOTE) The Xpert Xpress SARS-CoV-2/FLU/RSV plus assay is intended as an aid in the diagnosis of influenza from Nasopharyngeal swab specimens and should not be used as a sole basis for treatment. Nasal washings and aspirates are unacceptable for Xpert Xpress SARS-CoV-2/FLU/RSV testing.  Fact Sheet for  Patients: BloggerCourse.com  Fact Sheet for Healthcare Providers: SeriousBroker.it  This test is not yet approved or cleared by the [place] FDA and has been authorized for detection and/or diagnosis of SARS-CoV-2 by FDA under an Emergency Use Authorization (EUA). This EUA will remain in effect (meaning this test can be used) for the duration of the COVID-19 declaration under Section 564(b)(1) of the Act, 21 U.S.C. section 360bbb-3(b)(1), unless the authorization is terminated or revoked.  Performed at [facility], [address] [place], [place] [address]      Radiological Exams on Admission: CT ABDOMEN PELVIS WO CONTRAST  Result Date: 11/07/2021 CLINICAL DATA:  Nausea and vomiting with epigastric pain. History of pancreatitis. EXAM: CT ABDOMEN AND PELVIS WITHOUT CONTRAST TECHNIQUE: Multidetector  CT imaging of the abdomen and pelvis was performed following the standard protocol without IV contrast. COMPARISON:  CT abdomen and pelvis 12/16/2019. FINDINGS: Lower chest: There are atelectatic changes in both lung bases. Hepatobiliary: No focal liver abnormality is seen. Status post cholecystectomy. No biliary dilatation. Pancreas: There is mild diffuse peripancreatic inflammatory stranding and fluid. No fluid collections are identified. No pancreatic ductal dilatation. Spleen: Normal in size without focal abnormality. Adrenals/Urinary Tract: Adrenal glands are unremarkable. Kidneys are normal, without renal calculi, focal lesion, or hydronephrosis. Bladder is unremarkable. Stomach/Bowel: Stomach is within normal limits. Appendix appears normal. No evidence of bowel wall thickening, distention, or inflammatory changes. There is sigmoid and descending colon diverticulosis without evidence for acute diverticulitis. Vascular/Lymphatic: No significant vascular findings are present. No enlarged abdominal or pelvic lymph nodes.  Reproductive: Uterus and bilateral adnexa are unremarkable. Other: No abdominal wall hernia or abnormality. No abdominopelvic ascites. Musculoskeletal: No acute or significant osseous findings. IMPRESSION: Acute pancreatitis.  No evidence for fluid collection. Electronically Signed   By: Darliss Cheney M.D.   On: 11/07/2021 22:37    Assessment/Plan  1. Acute pancreatitis  - Pt w/ hx of pancreatitis p/w epigastric pain and nausea, unable to control sxs at home, has slightly elevated lipase and pancreatic inflammation on CT without fluid collection  - She denies any EtOH, is s/p cholecystectomy with no ductal dilation on CT, has normal triglycerides a few days ago, normal calcium level, and reports negative genetic testing with GI last yr  - Etiology unclear, could consider medications (i.e. losartan, HCTZ)  - Continue IVF hydration, bowel rest, pain-control, and monitor for complications    2. Hypertension  - BP at goal, treat as-needed only for now   3. Hypokalemia  - Replace potassium     DVT prophylaxis: Lovenox  Code Status: Full  Level of Care: Level of care: Med-Surg Family Communication: none present  Disposition Plan:  Patient is from: home Anticipated d/c is to: Home  Anticipated d/c date is: 12/3 or 11/10/21  Patient currently: Pending pain-control, tolerance of adequate oral intake  Consults called: none  Admission status: Observation     Briscoe Deutscher, MD [facility]  11/08/2021, 1:49 AM

## 2021-11-08 NOTE — Progress Notes (Signed)
Same day note  Patient seen and examined at bedside.  Patient was admitted to the hospital for nausea, vomiting, abdominal pain.  At the time of my evaluation, patient complains of feeling little better.  No active nausea or vomiting at this time.  Pain medicine has helped her pain.  Physical examination reveals average built female with mild epigastric tenderness on palpation.  Laboratory data and imaging was reviewed  Assessment and Plan.  Acute idiopathic pancreatitis  Patient with history of pancreatitis in the past.  CT scan this time shows pancreatic inflammation without fluid collection.  Patient was symptomatic with nausea vomiting and epigastric pain.  Lipase is not significantly elevated.  No history of alcohol usage.  Status postcholecystectomy.  CT scan without any biliary dilation.  Normal triglyceride levels.  Patient reported negative genetic testing with her GI last year.  Etiology of pancreatitis is unclear at this time.  Continue IV fluids, bowel rest, pain management.  P/w epigastric pain and nausea, unable to control sxs at home, has slightly elevated lipase and pancreatic inflammation on CT without fluid collection   Significant leukocytosis without obvious source of infection.  Could be secondary to pancreatitis could be reactive.  We will continue to monitor.  No need for antibiotic at this time.   Essential hypertension  Takes losartan HCTZ at home.  Currently on hold.   Hypokalemia  - Replace potassium potassium 3.1 today.  We will replenish.  Check levels in a.m.  Hold HCTZ and losartan for now.  No Charge  Signed,  Tenny Craw, MD [facility]

## 2021-11-09 LAB — COMPREHENSIVE METABOLIC PANEL
ALT: 18 U/L (ref 0–44)
AST: 21 U/L (ref 15–41)
Albumin: 2.7 g/dL — ABNORMAL LOW (ref 3.5–5.0)
Alkaline Phosphatase: 127 U/L — ABNORMAL HIGH (ref 38–126)
Anion gap: 4 — ABNORMAL LOW (ref 5–15)
BUN: 9 mg/dL (ref 6–20)
CO2: 29 mmol/L (ref 22–32)
Calcium: 8 mg/dL — ABNORMAL LOW (ref 8.9–10.3)
Chloride: 102 mmol/L (ref 98–111)
Creatinine, Ser: 0.57 mg/dL (ref 0.44–1.00)
GFR, Estimated: >60 mL/min (ref >60)
Glucose, Bld: 109 mg/dL — ABNORMAL HIGH (ref 70–99)
Potassium: 3.3 mmol/L — ABNORMAL LOW (ref 3.5–5.1)
Sodium: 135 mmol/L (ref 135–145)
Total Bilirubin: 0.5 mg/dL (ref 0.3–1.2)
Total Protein: 5.7 g/dL — ABNORMAL LOW (ref 6.5–8.1)

## 2021-11-09 LAB — CBC
HCT: 26.9 % — ABNORMAL LOW (ref 36.0–46.0)
Hemoglobin: 8.3 g/dL — ABNORMAL LOW (ref 12.0–15.0)
MCH: 24.7 pg — ABNORMAL LOW (ref 26.0–34.0)
MCHC: 30.9 g/dL (ref 30.0–36.0)
MCV: 80.1 fL (ref 80.0–100.0)
Platelets: 407 10*3/uL — ABNORMAL HIGH (ref 150–400)
RBC: 3.36 MIL/uL — ABNORMAL LOW (ref 3.87–5.11)
RDW: 14.6 % (ref 11.5–15.5)
WBC: 13.7 10*3/uL — ABNORMAL HIGH (ref 4.0–10.5)
nRBC: 0 % (ref 0.0–0.2)

## 2021-11-09 LAB — MAGNESIUM: Magnesium: 2.3 mg/dL (ref 1.7–2.4)

## 2021-11-09 MED ORDER — PANCRELIPASE (LIP-PROT-AMYL) 12000-38000 UNITS PO CPEP
12000.0000 [IU] | ORAL_CAPSULE | Freq: Three times a day (TID) | ORAL | Status: DC
  Administered 2021-11-09 – 2021-11-11 (×6): 12000 [IU] via ORAL
  Filled 2021-11-09 (×6): qty 1

## 2021-11-09 MED ORDER — SODIUM CHLORIDE 0.9 % IV SOLN: INTRAVENOUS | Status: DC

## 2021-11-09 MED ORDER — POTASSIUM CHLORIDE CRYS ER 20 MEQ PO TBCR
40.0000 meq | EXTENDED_RELEASE_TABLET | Freq: Once | ORAL | Status: AC
  Administered 2021-11-09: 40 meq via ORAL
  Filled 2021-11-09: qty 2

## 2021-11-09 MED ORDER — OXYCODONE-ACETAMINOPHEN 5-325 MG PO TABS
2.0000 | ORAL_TABLET | Freq: Four times a day (QID) | ORAL | Status: DC | PRN
  Administered 2021-11-10 – 2021-11-11 (×4): 2 via ORAL
  Filled 2021-11-09 (×4): qty 2

## 2021-11-09 NOTE — Plan of Care (Signed)
  Problem: Pain Managment: Goal: General experience of comfort will improve Outcome: Progressing   

## 2021-11-09 NOTE — Progress Notes (Addendum)
PROGRESS NOTE  Ashley Nichols ZOX:[id number] DOB: [birth year] DOA: 11/07/2021 PCP: System, Provider Not In   LOS: 1 day   Brief narrative: Ashley Nichols is a pleasant 50 y.o. female with medical history significant for hypertension, GERD, hyperparathyroidism, and pancreatitis presented hospital with epigastric pain nausea and vomiting.  No history of alcohol usage.  History of cholecystectomy in the past.  Patient states that she has had probably 4-5 episodes in the past and Creon helps her but this time she had worsening symptoms she decided to come to the hospital.  In the ED, patient was mildly tachycardic.  She had mild hypokalemia but had leukocytosis on presentation with thrombocytosis.  CT scan of the abdomen showed acute pancreatitis without fluid collection.  Patient was given IV fluids analgesics antiemetics and was admitted hospital for further evaluation and treatment.   Assessment/Plan:  Principal Problem:   Acute pancreatitis Active Problems:   Hypertension   Acute on likely chronic idiopathic pancreatitis  Patient with history of pancreatitis in the past.  States that Creon helps her.  We will start Creon.  CT scan abdomen shows pancreatic inflammation without fluid collection, no biliary dilatation. No history of alcohol usage.  Status postcholecystectomy.  Normal triglyceride levels.  Patient reported negative genetic testing with her GI last year.  Continue IV fluids, currently on clears,, pain management.  We will change dose of oxycodone to 2 tablets since pain is not adequately helped with oral.  We will try to decrease IV if possible.  Significant leukocytosis without obvious source of infection.  Trending down.  Likely reactive from pancreatitis.  Essential hypertension  Takes losartan HCTZ at home.  Currently on hold.  Blood pressure still stable.   Hypokalemia  Continue to hold HCTZ from home.  We will replenish potassium orally.  Check levels in a.m.  Latest  magnesium of 2.3.  DVT prophylaxis: enoxaparin (LOVENOX) injection 40 mg Start: 11/08/21 1000  Disposition Home when clinically improved.  Code Status: Full code  Family Communication: Spoke with the patient at bedside.  Status is: Inpatient  Remains inpatient appropriate because: Acute pancreatitis needing IV fluids IV antiemetics and IV analgesia.  Consultants: None  Procedures: None  Anti-infectives:  None  Anti-infectives (From admission, onward)    None      Subjective: Today, patient was seen and examined at bedside.  Patient complains of mild nausea and 3/4 epigastric pain.  Has not had a bowel movement.  On clears.  Had a little more pain on clears.  Wishes to be on Creon.  Objective: Vitals:   11/09/21 0132 11/09/21 0506  BP: 111/65 132/79  Pulse: 94 95  Resp: 16 16  Temp: 97.7 F (36.5 C) 97.8 F (36.6 C)  SpO2: 94% 96%    Intake/Output Summary (Last 24 hours) at 11/09/2021 1051 Last data filed at 11/09/2021 0600 Gross per 24 hour  Intake 1680 ml  Output --  Net 1680 ml   Filed Weights   11/09/21 0506  Weight: 86.2 kg   Body mass index is 30.67 kg/m.   Physical Exam: GENERAL: Patient is alert awake and oriented. Not in obvious distress.  Obese HENT: No scleral pallor or icterus. Pupils equally reactive to light. Oral mucosa is moist NECK: is supple, no gross swelling noted. CHEST: Clear to auscultation. No crackles or wheezes.  Diminished breath sounds bilaterally. CVS: S1 and S2 heard, no murmur. Regular rate and rhythm.  ABDOMEN: Soft, mild epigastric tenderness on palpation.  Bowel sounds are  present. EXTREMITIES: No edema. CNS: Cranial nerves are intact. No focal motor deficits. SKIN: warm and dry without rashes.  Data Review: I have personally reviewed the following laboratory data and studies,  CBC: Recent Labs  Lab 11/05/21 0559 11/07/21 1737 11/08/21 0533 11/09/21 0351  WBC 13.6* 34.7* 23.3* 13.7*  NEUTROABS 11.1* 31.3*   --   --   HGB 12.3 11.9* 9.1* 8.3*  HCT 38.6 38.2 29.0* 26.9*  MCV 79.8* 80.3 79.9* 80.1  PLT 452* 543* 375 407*   Basic Metabolic Panel: Recent Labs  Lab 11/05/21 0559 11/07/21 1737 11/08/21 0533 11/09/21 0351  NA 137 133* 134* 135  K 3.5 3.3* 3.1* 3.3*  CL 102 97* 104 102  CO2 26 27 27 29   GLUCOSE 87 121* 96 109*  BUN 11 14 13 9   CREATININE 0.66 0.95 0.73 0.57  CALCIUM 9.0 9.1 8.1* 8.0*  MG  --   --  2.1 2.3   Liver Function Tests: Recent Labs  Lab 11/05/21 0559 11/07/21 1737 11/08/21 0533 11/09/21 0351  AST 15 15 12* 21  ALT 15 15 13 18   ALKPHOS 85 125 101 127*  BILITOT 0.7 0.9 0.7 0.5  PROT 7.3 7.7 6.1* 5.7*  ALBUMIN 3.9 3.8 3.0* 2.7*   Recent Labs  Lab 11/05/21 0559 11/07/21 1737  LIPASE 1,502* 53*   No results for input(s): AMMONIA in the last 168 hours. Cardiac Enzymes: No results for input(s): CKTOTAL, CKMB, CKMBINDEX, TROPONINI in the last 168 hours. BNP (last 3 results) No results for input(s): BNP in the last 8760 hours.  ProBNP (last 3 results) No results for input(s): PROBNP in the last 8760 hours.  CBG: Recent Labs  Lab 11/05/21 0608  GLUCAP 92   Recent Results (from the past 240 hour(s))  Resp Panel by RT-PCR (Flu A&B, Covid) Nasopharyngeal Swab     Status: None   Collection Time: 11/07/21  5:37 PM   Specimen: Nasopharyngeal Swab; Nasopharyngeal(NP) swabs in vial transport medium  Result Value Ref Range Status   SARS Coronavirus 2 by RT PCR NEGATIVE NEGATIVE Final    Comment: (NOTE) SARS-CoV-2 target nucleic acids are NOT DETECTED.  The SARS-CoV-2 RNA is generally detectable in upper respiratory specimens during the acute phase of infection. The lowest concentration of SARS-CoV-2 viral copies this assay can detect is 138 copies/mL. A negative result does not preclude SARS-Cov-2 infection and should not be used as the sole basis for treatment or other patient management decisions. A negative result may occur with  improper  specimen collection/handling, submission of specimen other than nasopharyngeal swab, presence of viral mutation(s) within the areas targeted by this assay, and inadequate number of viral copies(<138 copies/mL). A negative result must be combined with clinical observations, patient history, and epidemiological information. The expected result is Negative.  Fact Sheet for Patients:  11/07/21  Fact Sheet for Healthcare Providers:  0609  This test is no t yet approved or cleared by the 14/01/22 FDA and  has been authorized for detection and/or diagnosis of SARS-CoV-2 by FDA under an Emergency Use Authorization (EUA). This EUA will remain  in effect (meaning this test can be used) for the duration of the COVID-19 declaration under Section 564(b)(1) of the Act, 21 U.S.C.section 360bbb-3(b)(1), unless the authorization is terminated  or revoked sooner.       Influenza A by PCR NEGATIVE NEGATIVE Final   Influenza B by PCR NEGATIVE NEGATIVE Final    Comment: (NOTE) The Xpert Xpress SARS-CoV-2/FLU/RSV plus assay is  intended as an aid in the diagnosis of influenza from Nasopharyngeal swab specimens and should not be used as a sole basis for treatment. Nasal washings and aspirates are unacceptable for Xpert Xpress SARS-CoV-2/FLU/RSV testing.  Fact Sheet for Patients: BloggerCourse.com  Fact Sheet for Healthcare Providers: SeriousBroker.it  This test is not yet approved or cleared by the [place] FDA and has been authorized for detection and/or diagnosis of SARS-CoV-2 by FDA under an Emergency Use Authorization (EUA). This EUA will remain in effect (meaning this test can be used) for the duration of the COVID-19 declaration under Section 564(b)(1) of the Act, 21 U.S.C. section 360bbb-3(b)(1), unless the authorization is terminated or revoked.  Performed at  [facility], [address], [place], [place] [address]      Studies: CT ABDOMEN PELVIS WO CONTRAST  Result Date: 11/07/2021 CLINICAL DATA:  Nausea and vomiting with epigastric pain. History of pancreatitis. EXAM: CT ABDOMEN AND PELVIS WITHOUT CONTRAST TECHNIQUE: Multidetector CT imaging of the abdomen and pelvis was performed following the standard protocol without IV contrast. COMPARISON:  CT abdomen and pelvis 12/16/2019. FINDINGS: Lower chest: There are atelectatic changes in both lung bases. Hepatobiliary: No focal liver abnormality is seen. Status post cholecystectomy. No biliary dilatation. Pancreas: There is mild diffuse peripancreatic inflammatory stranding and fluid. No fluid collections are identified. No pancreatic ductal dilatation. Spleen: Normal in size without focal abnormality. Adrenals/Urinary Tract: Adrenal glands are unremarkable. Kidneys are normal, without renal calculi, focal lesion, or hydronephrosis. Bladder is unremarkable. Stomach/Bowel: Stomach is within normal limits. Appendix appears normal. No evidence of bowel wall thickening, distention, or inflammatory changes. There is sigmoid and descending colon diverticulosis without evidence for acute diverticulitis. Vascular/Lymphatic: No significant vascular findings are present. No enlarged abdominal or pelvic lymph nodes. Reproductive: Uterus and bilateral adnexa are unremarkable. Other: No abdominal wall hernia or abnormality. No abdominopelvic ascites. Musculoskeletal: No acute or significant osseous findings. IMPRESSION: Acute pancreatitis.  No evidence for fluid collection. Electronically Signed   By: Darliss Cheney M.D.   On: 11/07/2021 22:37      Joycelyn Das, MD  [facility] 11/09/2021  If 7PM-7AM, please contact night-coverage

## 2021-11-10 LAB — CBC
HCT: 28.1 % — ABNORMAL LOW (ref 36.0–46.0)
Hemoglobin: 8.9 g/dL — ABNORMAL LOW (ref 12.0–15.0)
MCH: 25.4 pg — ABNORMAL LOW (ref 26.0–34.0)
MCHC: 31.7 g/dL (ref 30.0–36.0)
MCV: 80.1 fL (ref 80.0–100.0)
Platelets: 448 10*3/uL — ABNORMAL HIGH (ref 150–400)
RBC: 3.51 MIL/uL — ABNORMAL LOW (ref 3.87–5.11)
RDW: 14.7 % (ref 11.5–15.5)
WBC: 10.1 10*3/uL (ref 4.0–10.5)
nRBC: 0 % (ref 0.0–0.2)

## 2021-11-10 LAB — BASIC METABOLIC PANEL
Anion gap: 6 (ref 5–15)
BUN: <5 mg/dL — ABNORMAL LOW (ref 6–20)
CO2: 27 mmol/L (ref 22–32)
Calcium: 8.7 mg/dL — ABNORMAL LOW (ref 8.9–10.3)
Chloride: 103 mmol/L (ref 98–111)
Creatinine, Ser: 0.56 mg/dL (ref 0.44–1.00)
GFR, Estimated: >60 mL/min (ref >60)
Glucose, Bld: 104 mg/dL — ABNORMAL HIGH (ref 70–99)
Potassium: 3.5 mmol/L (ref 3.5–5.1)
Sodium: 136 mmol/L (ref 135–145)

## 2021-11-10 MED ORDER — POLYETHYLENE GLYCOL 3350 17 G PO PACK
17.0000 g | PACK | Freq: Every day | ORAL | Status: DC
  Administered 2021-11-10: 16:00:00 17 g via ORAL
  Filled 2021-11-10: qty 1

## 2021-11-10 MED ORDER — POTASSIUM CHLORIDE CRYS ER 20 MEQ PO TBCR
40.0000 meq | EXTENDED_RELEASE_TABLET | Freq: Two times a day (BID) | ORAL | Status: AC
  Administered 2021-11-10 (×2): 40 meq via ORAL
  Filled 2021-11-10 (×2): qty 2

## 2021-11-10 MED ORDER — SENNA 8.6 MG PO TABS
1.0000 | ORAL_TABLET | Freq: Two times a day (BID) | ORAL | Status: DC
  Administered 2021-11-10 (×2): 8.6 mg via ORAL
  Filled 2021-11-10 (×2): qty 1

## 2021-11-10 NOTE — Progress Notes (Signed)
PROGRESS NOTE  Ashley Nichols ENI:[id number] DOB: July 01, 1971 DOA: 11/07/2021 PCP: System, Provider Not In   LOS: 2 days   Brief narrative:  Ashley Nichols is a 50 y.o. female with medical history significant for hypertension, GERD, hyperparathyroidism, and previous history of pancreatitis presented to the hospital with epigastric pain, nausea and vomiting.  No history of alcohol usage.  History of cholecystectomy in the past.  Patient states that she has had probably 4-5 episodes in the past and Creon helps her but this time, she had worsening symptoms, could not afford Creon so she decided to come to the hospital.  In the ED, patient was mildly tachycardic.  She had mild hypokalemia but had leukocytosis on presentation with thrombocytosis.  CT scan of the abdomen showed acute pancreatitis without fluid collection.  Patient was given IV fluids, analgesics, antiemetics and was admitted hospital for further evaluation and treatment.   Assessment/Plan:  Principal Problem:   Acute pancreatitis Active Problems:   Hypertension  Acute on likely chronic idiopathic pancreatitis  Patient with history of pancreatitis in the past.  Has been started on Creon that she used to take in the past.  CT scan abdomen shows pancreatic inflammation without fluid collection, no biliary dilatation. No history of alcohol usage.  Status postcholecystectomy.  Normal triglyceride levels.  Patient reported negative genetic testing with her GI last year.  Continue IV fluids,  pain management.  Changed oxycodone to 2 tablets with aim of decreasing IV.  Will increase to full liquids today.  Significant leukocytosis without obvious source of infection.   Likely from acute pancreatitis.  Has improved at this time.  WBC of 10.1 today.  Essential hypertension  Takes losartan HCTZ at home.  Currently on hold.  Blood pressure stable at this time.   Hypokalemia  Continue to hold HCTZ from home.  We will replenish potassium  orally.    Latest magnesium of 2.3.  DVT prophylaxis: enoxaparin (LOVENOX) injection 40 mg Start: 11/08/21 1000  Disposition Home when clinically improved.  Likely in 1 to 2 days.  Code Status: Full code  Family Communication:  Spoke with the patient at bedside.  Status is: Inpatient  Remains inpatient appropriate because: Acute pancreatitis needing IV fluids, IV antiemetics and IV analgesia.  Consultants: None  Procedures: None  Anti-infectives:  None  Anti-infectives (From admission, onward)    None      Subjective: Today, patient was seen and examined at bedside.  Still complains of mild pain in the epigastric region.  Denies nausea vomiting.  Objective: Vitals:   11/09/21 2114 11/10/21 0544  BP: (!) 148/84 130/83  Pulse: 79 83  Resp: 16 16  Temp: 98.4 F (36.9 C) 98.4 F (36.9 C)  SpO2: 96% 98%    Intake/Output Summary (Last 24 hours) at 11/10/2021 0721 Last data filed at 11/10/2021 0544 Gross per 24 hour  Intake 1925 ml  Output --  Net 1925 ml    Filed Weights   11/09/21 0506  Weight: 86.2 kg   Body mass index is 30.67 kg/m.   Physical Exam: GENERAL: Obese, not in obvious distress, alert awake oriented.   HENT: No scleral pallor or icterus. Pupils equally reactive to light. Oral mucosa is moist NECK: is supple, no gross swelling noted. CHEST: Clear to auscultation. No crackles or wheezes.  Diminished breath sounds bilaterally. CVS: S1 and S2 heard, no murmur. Regular rate and rhythm.  ABDOMEN: Soft, mild epigastric tenderness on palpation. Bowel sounds are present. EXTREMITIES: No edema.  CNS: Cranial nerves are intact. No focal motor deficits. SKIN: warm and dry without rashes.  Data Review: I have personally reviewed the following laboratory data and studies,  CBC: Recent Labs  Lab 11/05/21 0559 11/07/21 1737 11/08/21 0533 11/09/21 0351  WBC 13.6* 34.7* 23.3* 13.7*  NEUTROABS 11.1* 31.3*  --   --   HGB 12.3 11.9* 9.1* 8.3*  HCT  38.6 38.2 29.0* 26.9*  MCV 79.8* 80.3 79.9* 80.1  PLT 452* 543* 375 407*    Basic Metabolic Panel: Recent Labs  Lab 11/05/21 0559 11/07/21 1737 11/08/21 0533 11/09/21 0351  NA 137 133* 134* 135  K 3.5 3.3* 3.1* 3.3*  CL 102 97* 104 102  CO2 26 27 27 29   GLUCOSE 87 121* 96 109*  BUN 11 14 13 9   CREATININE 0.66 0.95 0.73 0.57  CALCIUM 9.0 9.1 8.1* 8.0*  MG  --   --  2.1 2.3    Liver Function Tests: Recent Labs  Lab 11/05/21 0559 11/07/21 1737 11/08/21 0533 11/09/21 0351  AST 15 15 12* 21  ALT 15 15 13 18   ALKPHOS 85 125 101 127*  BILITOT 0.7 0.9 0.7 0.5  PROT 7.3 7.7 6.1* 5.7*  ALBUMIN 3.9 3.8 3.0* 2.7*    Recent Labs  Lab 11/05/21 0559 11/07/21 1737  LIPASE 1,502* 53*    No results for input(s): AMMONIA in the last 168 hours. Cardiac Enzymes: No results for input(s): CKTOTAL, CKMB, CKMBINDEX, TROPONINI in the last 168 hours. BNP (last 3 results) No results for input(s): BNP in the last 8760 hours.  ProBNP (last 3 results) No results for input(s): PROBNP in the last 8760 hours.  CBG: Recent Labs  Lab 11/05/21 0608  GLUCAP 92    Recent Results (from the past 240 hour(s))  Resp Panel by RT-PCR (Flu A&B, Covid) Nasopharyngeal Swab     Status: None   Collection Time: 11/07/21  5:37 PM   Specimen: Nasopharyngeal Swab; Nasopharyngeal(NP) swabs in vial transport medium  Result Value Ref Range Status   SARS Coronavirus 2 by RT PCR NEGATIVE NEGATIVE Final    Comment: (NOTE) SARS-CoV-2 target nucleic acids are NOT DETECTED.  The SARS-CoV-2 RNA is generally detectable in upper respiratory specimens during the acute phase of infection. The lowest concentration of SARS-CoV-2 viral copies this assay can detect is 138 copies/mL. A negative result does not preclude SARS-Cov-2 infection and should not be used as the sole basis for treatment or other patient management decisions. A negative result may occur with  improper specimen collection/handling,  submission of specimen other than nasopharyngeal swab, presence of viral mutation(s) within the areas targeted by this assay, and inadequate number of viral copies(<138 copies/mL). A negative result must be combined with clinical observations, patient history, and epidemiological information. The expected result is Negative.  Fact Sheet for Patients:  11/07/21  Fact Sheet for Healthcare Providers:  0609  This test is no t yet approved or cleared by the 14/01/22 FDA and  has been authorized for detection and/or diagnosis of SARS-CoV-2 by FDA under an Emergency Use Authorization (EUA). This EUA will remain  in effect (meaning this test can be used) for the duration of the COVID-19 declaration under Section 564(b)(1) of the Act, 21 U.S.C.section 360bbb-3(b)(1), unless the authorization is terminated  or revoked sooner.       Influenza A by PCR NEGATIVE NEGATIVE Final   Influenza B by PCR NEGATIVE NEGATIVE Final    Comment: (NOTE) The Xpert Xpress SARS-CoV-2/FLU/RSV plus assay  is intended as an aid in the diagnosis of influenza from Nasopharyngeal swab specimens and should not be used as a sole basis for treatment. Nasal washings and aspirates are unacceptable for Xpert Xpress SARS-CoV-2/FLU/RSV testing.  Fact Sheet for Patients: BloggerCourse.com  Fact Sheet for Healthcare Providers: SeriousBroker.it  This test is not yet approved or cleared by the [place] FDA and has been authorized for detection and/or diagnosis of SARS-CoV-2 by FDA under an Emergency Use Authorization (EUA). This EUA will remain in effect (meaning this test can be used) for the duration of the COVID-19 declaration under Section 564(b)(1) of the Act, 21 U.S.C. section 360bbb-3(b)(1), unless the authorization is terminated or revoked.  Performed at [facility], [address] [place], [place] [address]       Studies: No results found.    Joycelyn Das, MD  [facility] 11/10/2021  If 7PM-7AM, please contact night-coverage

## 2021-11-10 NOTE — Plan of Care (Signed)
  Problem: Coping: Goal: Level of anxiety will decrease Outcome: Progressing   Problem: Pain Managment: Goal: General experience of comfort will improve Outcome: Progressing   

## 2021-11-11 LAB — BASIC METABOLIC PANEL
Anion gap: 5 (ref 5–15)
BUN: <5 mg/dL — ABNORMAL LOW (ref 6–20)
CO2: 24 mmol/L (ref 22–32)
Calcium: 8.2 mg/dL — ABNORMAL LOW (ref 8.9–10.3)
Chloride: 106 mmol/L (ref 98–111)
Creatinine, Ser: 0.55 mg/dL (ref 0.44–1.00)
GFR, Estimated: >60 mL/min (ref >60)
Glucose, Bld: 123 mg/dL — ABNORMAL HIGH (ref 70–99)
Potassium: 3.7 mmol/L (ref 3.5–5.1)
Sodium: 135 mmol/L (ref 135–145)

## 2021-11-11 LAB — CBC
HCT: 25.8 % — ABNORMAL LOW (ref 36.0–46.0)
Hemoglobin: 8.1 g/dL — ABNORMAL LOW (ref 12.0–15.0)
MCH: 25 pg — ABNORMAL LOW (ref 26.0–34.0)
MCHC: 31.4 g/dL (ref 30.0–36.0)
MCV: 79.6 fL — ABNORMAL LOW (ref 80.0–100.0)
Platelets: 409 10*3/uL — ABNORMAL HIGH (ref 150–400)
RBC: 3.24 MIL/uL — ABNORMAL LOW (ref 3.87–5.11)
RDW: 14.8 % (ref 11.5–15.5)
WBC: 9.2 10*3/uL (ref 4.0–10.5)
nRBC: 0 % (ref 0.0–0.2)

## 2021-11-11 LAB — MAGNESIUM: Magnesium: 1.7 mg/dL (ref 1.7–2.4)

## 2021-11-11 MED ORDER — OXYCODONE-ACETAMINOPHEN 5-325 MG PO TABS: 1.0000 | ORAL_TABLET | Freq: Four times a day (QID) | ORAL | 0 refills | Status: AC | PRN

## 2021-11-11 MED ORDER — ONDANSETRON HCL 4 MG PO TABS: 4.0000 mg | ORAL_TABLET | Freq: Every day | ORAL | 0 refills | Status: AC | PRN

## 2021-11-11 NOTE — Plan of Care (Signed)

## 2021-11-11 NOTE — Plan of Care (Signed)
  Problem: Pain Managment: Goal: General experience of comfort will improve Outcome: Progressing   Problem: Elimination: Goal: Will not experience complications related to bowel motility Outcome: Progressing Goal: Will not experience complications related to urinary retention Outcome: Progressing   

## 2021-11-11 NOTE — Discharge Summary (Signed)
Physician Discharge Summary  [name]:[id number] DOB: Jan 31, 1971 DOA: 11/07/2021  PCP: System, Provider Not In  Admit date: 11/07/2021 Discharge date: 11/11/2021  Admitted From: Home  Discharge disposition: Home  Recommendations for Outpatient Follow-Up:   Follow up with your primary care provider in one week.  Check CBC, BMP, magnesium in the next visit  Discharge Diagnosis:   Principal Problem:   Acute pancreatitis Active Problems:   Hypertension   Discharge Condition: Improved.  Diet recommendation: Soft low-fat diet.    Wound care: None.  Code status: Full.   History of Present Illness:   Ashley Nichols is a 50 y.o. female with medical history significant for hypertension, GERD, hyperparathyroidism, and previous history of pancreatitis presented to the hospital with epigastric pain, nausea and vomiting.  No history of alcohol usage.  History of cholecystectomy in the past.  Patient states that she has had probably 4-5 episodes in the past and Creon helps her but this time, she had worsening symptoms, could not afford Creon so she decided to come to the hospital.  In the ED, patient was mildly tachycardic.  She had mild hypokalemia but had leukocytosis on presentation with thrombocytosis.  CT scan of the abdomen showed acute pancreatitis without fluid collection.  Patient was given IV fluids, analgesics, antiemetics and was admitted hospital for further evaluation and treatment.    Hospital Course:   Following conditions were addressed during hospitalization as listed below,  Acute on likely chronic idiopathic pancreatitis  Patient with history of pancreatitis in the past.  Has been started on Creon that she used to take in the past.  CT scan abdomen shows pancreatic inflammation without fluid collection, no biliary dilatation. No history of alcohol usage.  Status postcholecystectomy.  Normal triglyceride levels.  Patient reported negative genetic testing with  her GI last year.  Patient has been advanced on diet which she has tolerated.    Significant leukocytosis without obvious source of infection.   Likely from acute pancreatitis.  Reactive in nature.  WBC prior to discharge was 9.2.   Essential hypertension  Takes losartan HCTZ at home. Resume on discharge.   Hypokalemia  Replenished.    Latest magnesium of 2.3.  Disposition.  At this time, patient is stable for disposition outpatient PCP follow-up.  Medical Consultants:   None.  Procedures:    None Subjective:   Today, patient was seen and examined at bedside.  Denies overt pain, has tolerated p.o. diet.  She wishes to go home  Discharge Exam:   Vitals:   11/10/21 2227 11/11/21 0515  BP: (!) 161/88 (!) 143/80  Pulse: 70 73  Resp: 18 16  Temp: 98.7 F (37.1 C) 97.8 F (36.6 C)  SpO2: 98% 97%   Vitals:   11/10/21 0544 11/10/21 1429 11/10/21 2227 11/11/21 0515  BP: 130/83 (!) 157/81 (!) 161/88 (!) 143/80  Pulse: 83 72 70 73  Resp: Temp: 98.4 F (36.9 C) 98.2 F (36.8 C) 98.7 F (37.1 C) 97.8 F (36.6 C)  TempSrc: Oral Oral Oral Oral  SpO2: 98% 97% 98% 97%  Weight:      Height:       General: Alert awake, not in obvious distress, obese HENT: pupils equally reacting to light,  No scleral pallor or icterus noted. Oral mucosa is moist.  Chest:  Clear breath sounds.  Diminished breath sounds bilaterally. No crackles or wheezes.  CVS: S1 &S2 heard. No murmur.  Regular rate and  rhythm. Abdomen: Soft, nontender, nondistended.  Bowel sounds are heard.   Extremities: No cyanosis, clubbing or edema.  Peripheral pulses are palpable. Psych: Alert, awake and oriented, normal mood CNS:  No cranial nerve deficits.  Power equal in all extremities.   Skin: Warm and dry.  No rashes noted.  The results of significant diagnostics from this hospitalization (including imaging, microbiology, ancillary and laboratory) are listed below for reference.     Diagnostic  Studies:   CT ABDOMEN PELVIS WO CONTRAST  Result Date: 11/07/2021 CLINICAL DATA:  Nausea and vomiting with epigastric pain. History of pancreatitis. EXAM: CT ABDOMEN AND PELVIS WITHOUT CONTRAST TECHNIQUE: Multidetector CT imaging of the abdomen and pelvis was performed following the standard protocol without IV contrast. COMPARISON:  CT abdomen and pelvis 12/16/2019. FINDINGS: Lower chest: There are atelectatic changes in both lung bases. Hepatobiliary: No focal liver abnormality is seen. Status post cholecystectomy. No biliary dilatation. Pancreas: There is mild diffuse peripancreatic inflammatory stranding and fluid. No fluid collections are identified. No pancreatic ductal dilatation. Spleen: Normal in size without focal abnormality. Adrenals/Urinary Tract: Adrenal glands are unremarkable. Kidneys are normal, without renal calculi, focal lesion, or hydronephrosis. Bladder is unremarkable. Stomach/Bowel: Stomach is within normal limits. Appendix appears normal. No evidence of bowel wall thickening, distention, or inflammatory changes. There is sigmoid and descending colon diverticulosis without evidence for acute diverticulitis. Vascular/Lymphatic: No significant vascular findings are present. No enlarged abdominal or pelvic lymph nodes. Reproductive: Uterus and bilateral adnexa are unremarkable. Other: No abdominal wall hernia or abnormality. No abdominopelvic ascites. Musculoskeletal: No acute or significant osseous findings. IMPRESSION: Acute pancreatitis.  No evidence for fluid collection. Electronically Signed   By: Darliss Cheney M.D.   On: 11/07/2021 22:37     Labs:   Basic Metabolic Panel: Recent Labs  Lab 11/07/21 1737 11/08/21 0533 11/09/21 0351 11/10/21 0752 11/11/21 0322  NA 133* 134* 135 136 135  K 3.3* 3.1* 3.3* 3.5 3.7  CL 97* 104 102 103 106  CO2 27 27 29 27 24   GLUCOSE 121* 96 109* 104* 123*  BUN 14 13 9  <5* <5*  CREATININE 0.95 0.73 0.57 0.56 0.55  CALCIUM 9.1 8.1* 8.0*  8.7* 8.2*  MG  --  2.1 2.3  --  1.7   GFR Estimated Creatinine Clearance: 94.1 mL/min (by C-G formula based on SCr of 0.55 mg/dL). Liver Function Tests: Recent Labs  Lab 11/05/21 0559 11/07/21 1737 11/08/21 0533 11/09/21 0351  AST 15 15 12* 21  ALT 15 15 13 18   ALKPHOS 85 125 101 127*  BILITOT 0.7 0.9 0.7 0.5  PROT 7.3 7.7 6.1* 5.7*  ALBUMIN 3.9 3.8 3.0* 2.7*   Recent Labs  Lab 11/05/21 0559 11/07/21 1737  LIPASE 1,502* 53*   No results for input(s): AMMONIA in the last 168 hours. Coagulation profile No results for input(s): INR, PROTIME in the last 168 hours.  CBC: Recent Labs  Lab 11/05/21 0559 11/07/21 1737 11/08/21 0533 11/09/21 0351 11/10/21 0752 11/11/21 0322  WBC 13.6* 34.7* 23.3* 13.7* 10.1 9.2  NEUTROABS 11.1* 31.3*  --   --   --   --   HGB 12.3 11.9* 9.1* 8.3* 8.9* 8.1*  HCT 38.6 38.2 29.0* 26.9* 28.1* 25.8*  MCV 79.8* 80.3 79.9* 80.1 80.1 79.6*  PLT 452* 543* 375 407* 448* 409*   Cardiac Enzymes: No results for input(s): CKTOTAL, CKMB, CKMBINDEX, TROPONINI in the last 168 hours. BNP: Invalid input(s): POCBNP CBG: Recent Labs  Lab 11/05/21 0608  GLUCAP 92  D-Dimer No results for input(s): DDIMER in the last 72 hours. Hgb A1c No results for input(s): HGBA1C in the last 72 hours. Lipid Profile No results for input(s): CHOL, HDL, LDLCALC, TRIG, CHOLHDL, LDLDIRECT in the last 72 hours. Thyroid function studies No results for input(s): TSH, T4TOTAL, T3FREE, THYROIDAB in the last 72 hours.  Invalid input(s): FREET3 Anemia work up No results for input(s): VITAMINB12, FOLATE, FERRITIN, TIBC, IRON, RETICCTPCT in the last 72 hours. Microbiology Recent Results (from the past 240 hour(s))  Resp Panel by RT-PCR (Flu A&B, Covid) Nasopharyngeal Swab     Status: None   Collection Time: 11/07/21  5:37 PM   Specimen: Nasopharyngeal Swab; Nasopharyngeal(NP) swabs in vial transport medium  Result Value Ref Range Status   SARS Coronavirus 2 by RT PCR  NEGATIVE NEGATIVE Final    Comment: (NOTE) SARS-CoV-2 target nucleic acids are NOT DETECTED.  The SARS-CoV-2 RNA is generally detectable in upper respiratory specimens during the acute phase of infection. The lowest concentration of SARS-CoV-2 viral copies this assay can detect is 138 copies/mL. A negative result does not preclude SARS-Cov-2 infection and should not be used as the sole basis for treatment or other patient management decisions. A negative result may occur with  improper specimen collection/handling, submission of specimen other than nasopharyngeal swab, presence of viral mutation(s) within the areas targeted by this assay, and inadequate number of viral copies(<138 copies/mL). A negative result must be combined with clinical observations, patient history, and epidemiological information. The expected result is Negative.  Fact Sheet for Patients:  BloggerCourse.com  Fact Sheet for Healthcare Providers:  SeriousBroker.it  This test is no t yet approved or cleared by the [place] FDA and  has been authorized for detection and/or diagnosis of SARS-CoV-2 by FDA under an Emergency Use Authorization (EUA). This EUA will remain  in effect (meaning this test can be used) for the duration of the COVID-19 declaration under Section 564(b)(1) of the Act, 21 U.S.C.section 360bbb-3(b)(1), unless the authorization is terminated  or revoked sooner.       Influenza A by PCR NEGATIVE NEGATIVE Final   Influenza B by PCR NEGATIVE NEGATIVE Final    Comment: (NOTE) The Xpert Xpress SARS-CoV-2/FLU/RSV plus assay is intended as an aid in the diagnosis of influenza from Nasopharyngeal swab specimens and should not be used as a sole basis for treatment. Nasal washings and aspirates are unacceptable for Xpert Xpress SARS-CoV-2/FLU/RSV testing.  Fact Sheet for Patients: BloggerCourse.com  Fact Sheet for  Healthcare Providers: SeriousBroker.it  This test is not yet approved or cleared by the [place] FDA and has been authorized for detection and/or diagnosis of SARS-CoV-2 by FDA under an Emergency Use Authorization (EUA). This EUA will remain in effect (meaning this test can be used) for the duration of the COVID-19 declaration under Section 564(b)(1) of the Act, 21 U.S.C. section 360bbb-3(b)(1), unless the authorization is terminated or revoked.  Performed at [id number], [address] [place], [place] [address]      Discharge Instructions:   Discharge Instructions     Call MD for:  persistant nausea and vomiting   Complete by: As directed    Call MD for:  severe uncontrolled pain   Complete by: As directed    Call MD for:  temperature >100.4   Complete by: As directed    Diet - low sodium heart healthy   Complete by: As directed    Discharge instructions   Complete by: As directed  Follow-up with your primary care physician in 1 week.  Stay on soft fat-free diet. Seek medical attention for worsening symptoms.   Increase activity slowly   Complete by: As directed       Allergies as of 11/11/2021       Reactions   Ivp Dye [iodinated Diagnostic Agents] Hives        Medication List     TAKE these medications    Creon 36000 UNITS Cpep capsule Generic drug: lipase/protease/amylase Take 72,000 Units by mouth 4 (four) times daily.   diphenhydrAMINE 25 MG tablet Commonly known as: BENADRYL Take 25 mg by mouth at bedtime.   Junel 1/20 1-20 MG-MCG tablet Generic drug: norethindrone-ethinyl estradiol Take 1 tablet by mouth daily.   loratadine 10 MG tablet Commonly known as: CLARITIN Take 10 mg by mouth at bedtime.   losartan-hydrochlorothiazide 50-12.5 MG tablet Commonly known as: HYZAAR Take 0.5 tablets by mouth daily.   Melatonin 10 MG Subl Take 20 mg by mouth at bedtime.   metoCLOPramide 10 MG  tablet Commonly known as: REGLAN Take 1 tablet (10 mg total) by mouth every 6 (six) hours.   naproxen sodium 220 MG tablet Commonly known as: ALEVE Take 440 mg by mouth daily as needed (pain).   nortriptyline 50 MG capsule Commonly known as: PAMELOR Take 100 mg by mouth at bedtime.   Nurtec 75 MG Tbdp Generic drug: Rimegepant Sulfate Take 75 mg by mouth as directed. 1 tab at onset of migraine/ 24 hr   omeprazole 40 MG capsule Commonly known as: PRILOSEC Take 40 mg by mouth daily.   ondansetron 4 MG tablet Commonly known as: Zofran Take 1 tablet (4 mg total) by mouth daily as needed for nausea or vomiting.   oxyCODONE-acetaminophen 5-325 MG tablet Commonly known as: PERCOCET/ROXICET Take 1 tablet by mouth every 6 (six) hours as needed for severe pain.   promethazine 25 MG tablet Commonly known as: PHENERGAN Take 25 mg by mouth 4 (four) times daily as needed for nausea/vomiting.   RED YEAST RICE PO Take 2 capsules by mouth daily.   sertraline 50 MG tablet Commonly known as: ZOLOFT Take 50 mg by mouth daily.   Vitamin D3 125 MCG (5000 UT) Caps Take 5,000 Units by mouth daily.         Time coordinating discharge: 39 minutes  Signed:  Xylan Sheils  [facility] 11/11/2021, 8:16 AM

## 2021-11-26 IMAGING — MR MR ABDOMEN WO/W CM MRCP
13 of 20 series · 26 of 48 positions shown · IV contrast (20ml Multihance)
Comparison: CT evaluation from December 16, 2019

CLINICAL DATA: 49-year-old female with history of pancreatitis by
report. Post cholecystectomy in this 49-year-old female

EXAM:
MRI ABDOMEN WITHOUT AND WITH CONTRAST (INCLUDING MRCP)
TECHNIQUE: Multiplanar multisequence MR imaging of the abdomen was performed
both before and after the administration of intravenous contrast.
Heavily T2-weighted images of the biliary and pancreatic ducts were
obtained, and three-dimensional MRCP images were rendered by post
processing.
CONTRAST:  20mL MULTIHANCE GADOBENATE DIMEGLUMINE 529 MG/ML IV SOLN

[Series 3: cor haste · coronal · 5.0mm · 0.74mm/px · 2 of 42 slices shown]
[im 1/42]
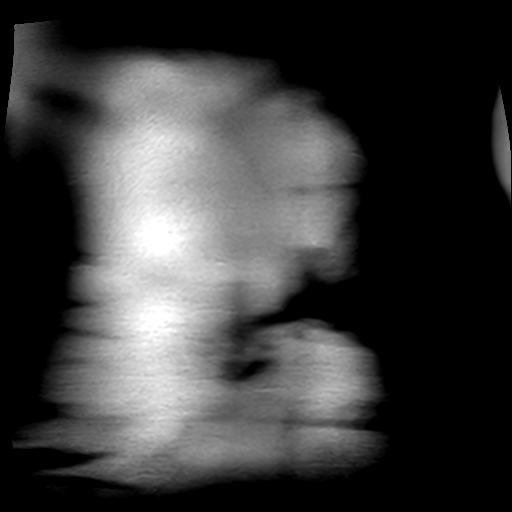
[im 42/42]
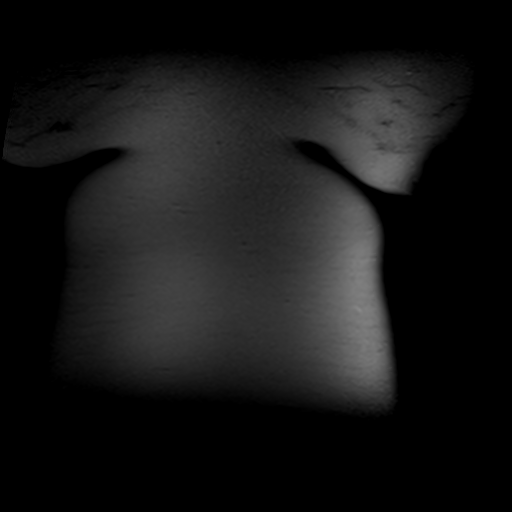

[Series 4: bSSFP · coronal · 5.0mm · 1.04mm/px · 2 of 38 slices shown (1 of 2)]
[im 1/38]
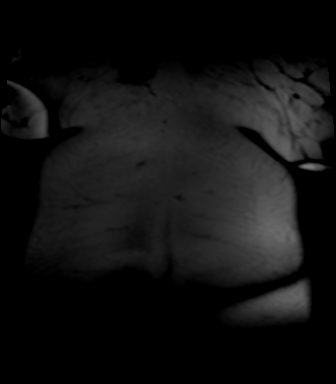
[im 38/38]
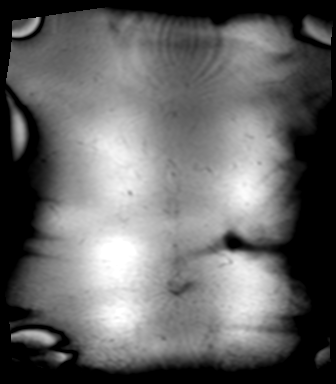

[Series 5: T2 · coronal · 3.0mm · 0.94mm/px · 2 of 77 slices shown (1 of 2)]
[im 1/77]
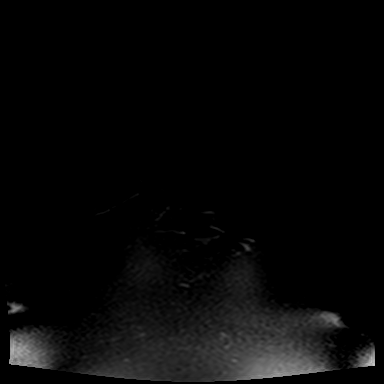
[im 77/77]
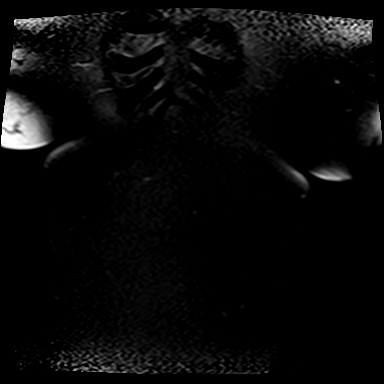

[Series 8: T1 · axial · 6.0mm · 0.74mm/px · z∈[-136,+108]mm · 2 of 76 slices shown]
[im 1/76]
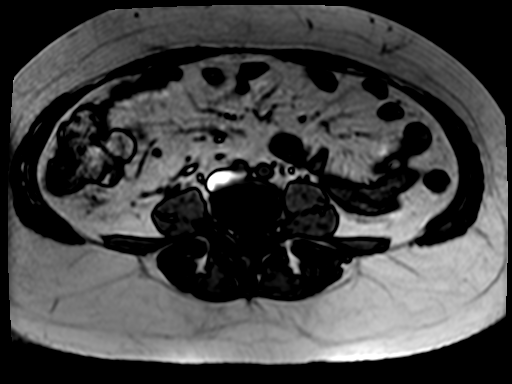
[im 76/76]
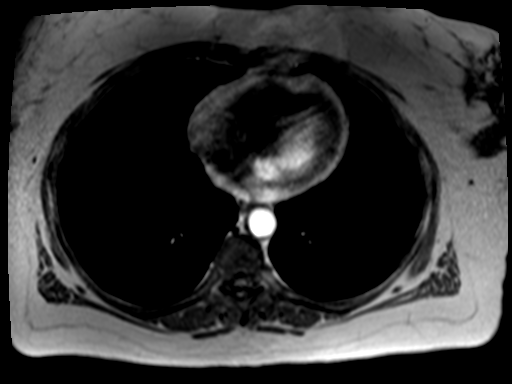

[Series 9: axial haste · axial · 6.0mm · 0.74mm/px · 1 of 35 slices shown]
[im 1/35]
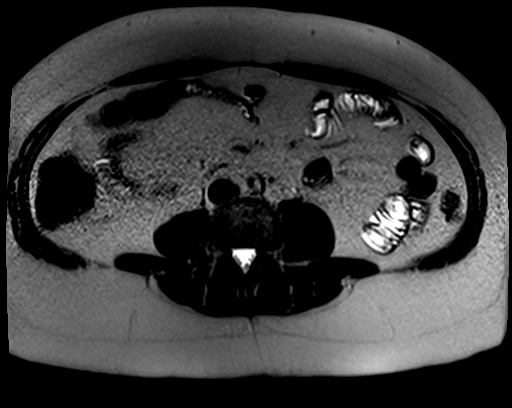

[Series 10: T2 · axial · 6.0mm · 1.56mm/px · 1 of 35 slices shown (2 of 2)]
[im 1/35]
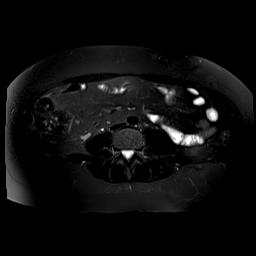

[Series 15: [id number]_trig · axial · 6.0mm · 2.08mm/px · z∈[-107,+101]mm · 3 of 90 slices shown]
[im 1/90]
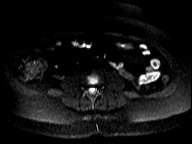
[im 45/90]
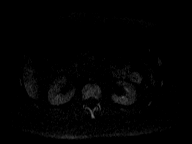
[im 90/90]
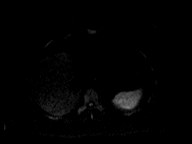

[Series 16: [id number]_trig_adc · axial · 6.0mm · 2.08mm/px · 1 of 30 slices shown]
[im 1/30]
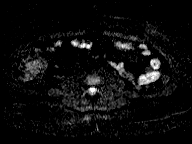

[Series 17: bSSFP · axial · 4.0mm · 0.74mm/px · z∈[-118,+110]mm · 2 of 58 slices shown (2 of 2)]
[im 1/58]
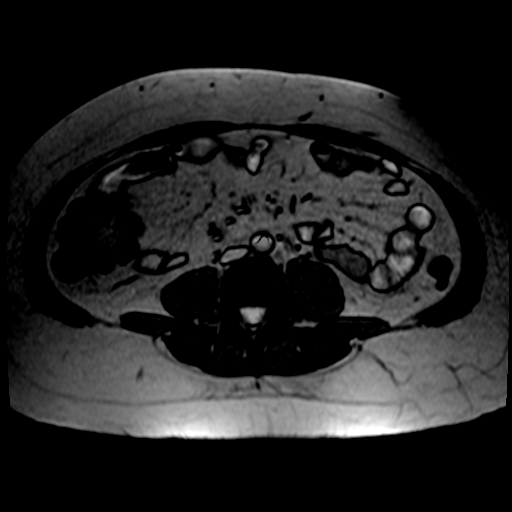
[im 58/58]
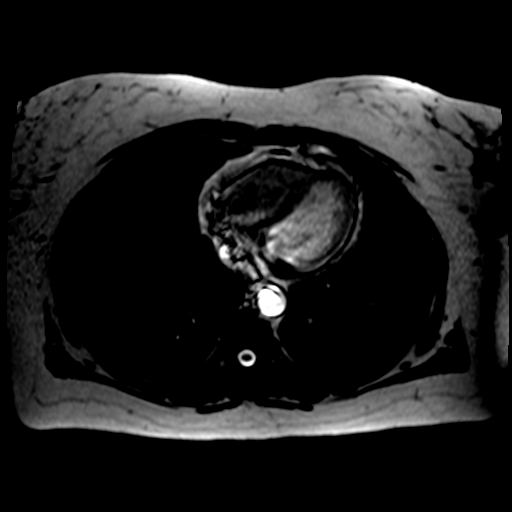

[Series 18: T1 dynamic · axial · non-contrast · 2.5mm · 0.74mm/px · z∈[-123,+115]mm · 3 of 96 slices shown]
[im 1/96]
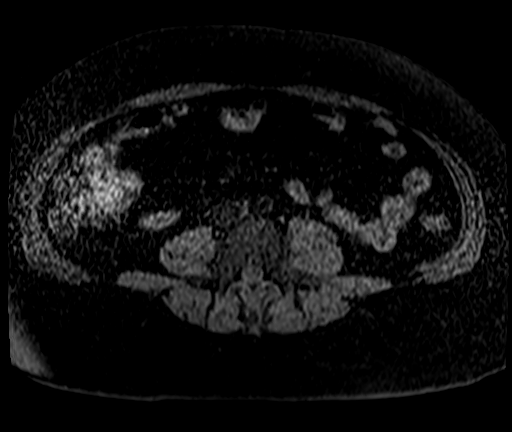
[im 48/96]
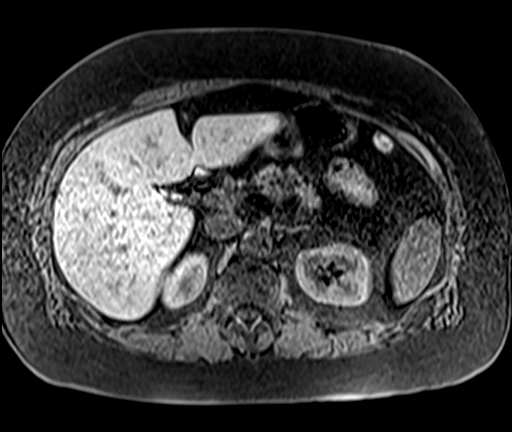
[im 96/96]
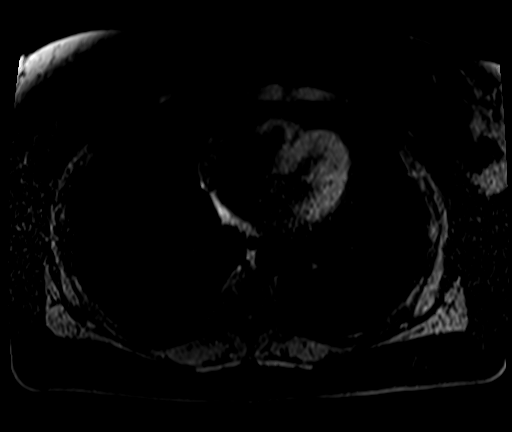

[Series 19: T1 dynamic post-contrast · axial · 2.5mm · 0.74mm/px · z∈[-123,+115]mm · 3 of 96 slices shown (1 of 3)]
[im 1/96]
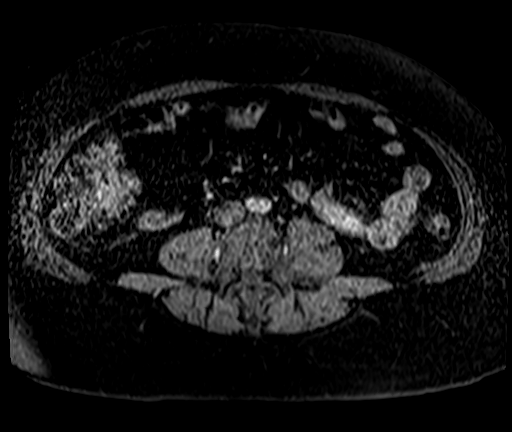
[im 48/96]
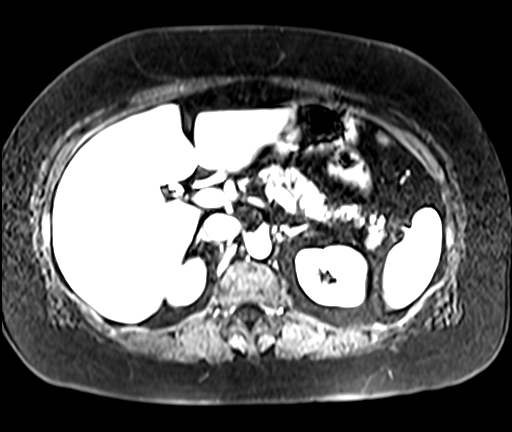
[im 96/96]
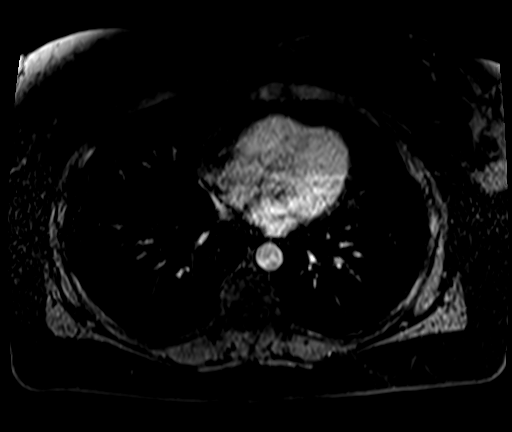

[Series 20: T1 dynamic post-contrast · axial · 2.5mm · 0.74mm/px · z∈[-123,+115]mm · 3 of 96 slices shown (2 of 3)]
[im 1/96]
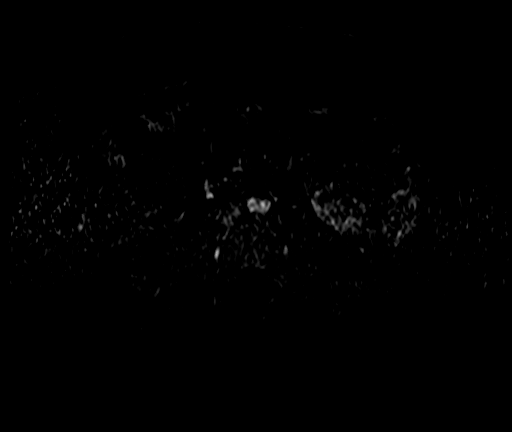
[im 48/96]
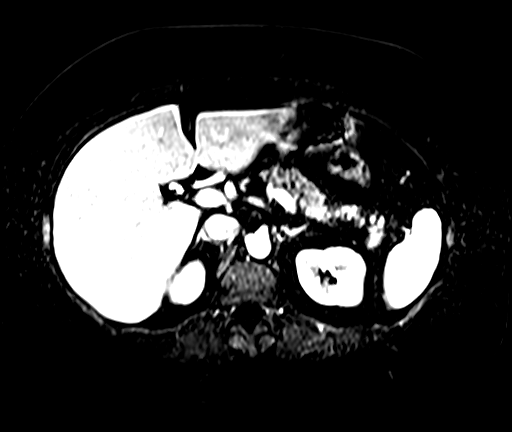
[im 96/96]
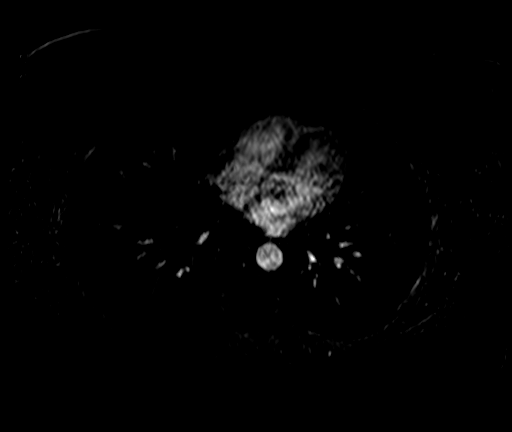

[Series 21: T1 dynamic post-contrast · axial · 2.5mm · 0.74mm/px · 1 of 96 slices shown (3 of 3)]
[im 1/96]
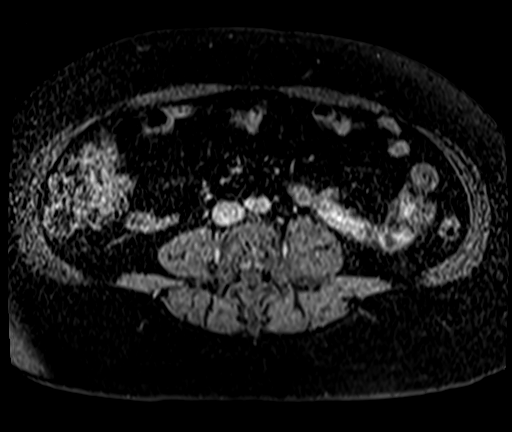

[26 of 48 positions shown; findings below may reference images not displayed]

FINDINGS: Lower chest: Incidental imaging of the lung bases without effusion
or consolidation.

Hepatobiliary: No fat or iron in the liver parenchyma. No focal,
suspicious hepatic lesion. Normal caliber common bile duct without
signs of filling defect or focal narrowing.

High-resolution images moderately degraded by respiratory motion.
Posterior division RIGHT hepatic duct low insertion onto the hepatic
duct above the cystic duct confluence. Post cholecystectomy as
before.

Pancreas: Mild decreased T1 signal in the pancreas is suspected. No
increased T2 signal or peripancreatic edema. No sign of pancreatic
divisum grossly though high-resolution images show limited
assessment due to respiratory variation during the evaluation.
Subtle focus of variable enhancement anterior to the SMV on image 59
of series 27 measuring approximately 5-7 mm.

Spleen:  Normal

Adrenals/Urinary Tract:  Adrenal glands are normal.

Symmetric renal enhancement. No hydronephrosis, no focal, suspicious
renal lesion.

Stomach/Bowel: Gastrointestinal tract is unremarkable to the extent
evaluated

Vascular/Lymphatic:  Portal vein, SMV and splenic veins are patent

Small splenic artery aneurysm near the splenic hilum measuring 9 mm
(image 40, series 19) no aneurysmal dilation of the abdominal aorta.
There is no gastrohepatic or hepatoduodenal ligament
lymphadenopathy. No retroperitoneal or mesenteric lymphadenopathy.

Other:  No ascites.

Musculoskeletal: No suspicious bone lesions identified.
IMPRESSION: 1. No signs of acute pancreatitis or fluid collection. Signs of
prior pancreatitis within the string to margins of the pancreas and
diminished T1 signal in the pancreatic parenchyma.
2. Subtle increased vascular enhancement about the neck of the
pancreas anterior to the SMV on image 58 of series 27, could
represent a small neuroendocrine tumor or variable enhancement from
prior pancreatitis. Could consider short interval follow-up MRI at 3
months or endoscopic assessment as warranted for further evaluation.
3. Patent splenic and portal venous systems.
4. Variant biliary ductal anatomy post cholecystectomy.
5. No signs of pancreatic divisum grossly though MRCP sequences are
limited in the pancreatic duct is quite small limiting assessment.
6. Small splenic artery aneurysm near the splenic hilum measuring 9
mm. Attention on follow-up, consider annual follow-up imaging for
surveillance.

## 2021-12-25 ENCOUNTER — Other Ambulatory Visit: Payer: Self-pay

## 2021-12-25 DIAGNOSIS — K8500 Idiopathic acute pancreatitis without necrosis or infection: Secondary | ICD-10-CM

## 2021-12-31 ENCOUNTER — Other Ambulatory Visit: Payer: Self-pay

## 2022-09-03 ENCOUNTER — Other Ambulatory Visit: Payer: Self-pay | Admitting: Family Medicine

## 2022-09-03 DIAGNOSIS — I728 Aneurysm of other specified arteries: Secondary | ICD-10-CM

## 2023-08-18 ENCOUNTER — Other Ambulatory Visit (HOSPITAL_COMMUNITY): Payer: Self-pay | Admitting: Gastroenterology

## 2023-08-18 DIAGNOSIS — K861 Other chronic pancreatitis: Secondary | ICD-10-CM

## 2023-08-18 DIAGNOSIS — I728 Aneurysm of other specified arteries: Secondary | ICD-10-CM

## 2023-08-24 ENCOUNTER — Other Ambulatory Visit: Payer: Self-pay

## 2023-08-24 ENCOUNTER — Other Ambulatory Visit (HOSPITAL_COMMUNITY): Payer: Self-pay | Admitting: Gastroenterology

## 2023-08-24 ENCOUNTER — Ambulatory Visit (HOSPITAL_COMMUNITY)
Admission: RE | Admit: 2023-08-24 | Discharge: 2023-08-24 | Disposition: A | Discharge status: Home / Self Care | Payer: 59 | Source: Ambulatory Visit | Attending: Gastroenterology | Admitting: Gastroenterology

## 2023-08-24 DIAGNOSIS — K861 Other chronic pancreatitis: Secondary | ICD-10-CM

## 2023-08-24 DIAGNOSIS — I728 Aneurysm of other specified arteries: Secondary | ICD-10-CM | POA: Diagnosis present

## 2023-08-24 MED ORDER — GADOBUTROL 1 MMOL/ML IV SOLN
9.0000 mL | Freq: Once | INTRAVENOUS | Status: AC | PRN
  Administered 2023-08-24: 9 mL via INTRAVENOUS
